# Patient Record
Sex: Female | Born: 1950 | Race: White | Hispanic: No | Marital: Married | Smoking: Never smoker
Health system: Southern US, Community
[De-identification: ages and names within clinical notes are randomized; demographics above are authoritative.]

## PROBLEM LIST (undated history)

## (undated) DIAGNOSIS — R112 Nausea with vomiting, unspecified: Secondary | ICD-10-CM

## (undated) DIAGNOSIS — K219 Gastro-esophageal reflux disease without esophagitis: Secondary | ICD-10-CM

## (undated) DIAGNOSIS — F329 Major depressive disorder, single episode, unspecified: Secondary | ICD-10-CM

## (undated) DIAGNOSIS — E039 Hypothyroidism, unspecified: Secondary | ICD-10-CM

## (undated) DIAGNOSIS — Z9889 Other specified postprocedural states: Secondary | ICD-10-CM

## (undated) DIAGNOSIS — Z9861 Coronary angioplasty status: Principal | ICD-10-CM

## (undated) DIAGNOSIS — I2510 Atherosclerotic heart disease of native coronary artery without angina pectoris: Principal | ICD-10-CM

## (undated) DIAGNOSIS — R238 Other skin changes: Secondary | ICD-10-CM

## (undated) DIAGNOSIS — F32A Depression, unspecified: Secondary | ICD-10-CM

## (undated) DIAGNOSIS — B029 Zoster without complications: Secondary | ICD-10-CM

## (undated) DIAGNOSIS — E785 Hyperlipidemia, unspecified: Secondary | ICD-10-CM

## (undated) DIAGNOSIS — I10 Essential (primary) hypertension: Secondary | ICD-10-CM

## (undated) DIAGNOSIS — F419 Anxiety disorder, unspecified: Secondary | ICD-10-CM

## (undated) DIAGNOSIS — R233 Spontaneous ecchymoses: Secondary | ICD-10-CM

## (undated) HISTORY — DX: Atherosclerotic heart disease of native coronary artery without angina pectoris: I25.10

## (undated) HISTORY — DX: Gastro-esophageal reflux disease without esophagitis: K21.9

## (undated) HISTORY — DX: Hypothyroidism, unspecified: E03.9

## (undated) HISTORY — DX: Hyperlipidemia, unspecified: E78.5

## (undated) HISTORY — DX: Coronary angioplasty status: Z98.61

## (undated) HISTORY — PX: CARDIAC CATHETERIZATION: SHX172

## (undated) HISTORY — DX: Major depressive disorder, single episode, unspecified: F32.9

## (undated) HISTORY — DX: Spontaneous ecchymoses: R23.3

## (undated) HISTORY — PX: TRANSTHORACIC ECHOCARDIOGRAM: SHX275

## (undated) HISTORY — DX: Zoster without complications: B02.9

## (undated) HISTORY — DX: Hypercalcemia: E83.52

## (undated) HISTORY — DX: Other skin changes: R23.8

## (undated) HISTORY — DX: Anxiety disorder, unspecified: F41.9

## (undated) HISTORY — DX: Depression, unspecified: F32.A

---

## 1990-03-31 HISTORY — PX: TUBAL LIGATION: SHX77

## 2003-07-14 ENCOUNTER — Ambulatory Visit (HOSPITAL_COMMUNITY): Admission: RE | Admit: 2003-07-14 | Discharge: 2003-07-14 | Payer: Self-pay | Admitting: Family Medicine

## 2009-05-29 DIAGNOSIS — B029 Zoster without complications: Secondary | ICD-10-CM

## 2009-05-29 HISTORY — DX: Zoster without complications: B02.9

## 2010-02-28 DIAGNOSIS — I2510 Atherosclerotic heart disease of native coronary artery without angina pectoris: Secondary | ICD-10-CM

## 2010-02-28 HISTORY — DX: Atherosclerotic heart disease of native coronary artery without angina pectoris: I25.10

## 2010-02-28 HISTORY — PX: OTHER SURGICAL HISTORY: SHX169

## 2010-03-05 HISTORY — PX: CORONARY ANGIOPLASTY: SHX604

## 2011-04-14 ENCOUNTER — Encounter (INDEPENDENT_AMBULATORY_CARE_PROVIDER_SITE_OTHER): Payer: Self-pay | Admitting: Surgery

## 2011-04-16 ENCOUNTER — Encounter (INDEPENDENT_AMBULATORY_CARE_PROVIDER_SITE_OTHER): Payer: Self-pay | Admitting: Surgery

## 2011-04-16 ENCOUNTER — Ambulatory Visit (INDEPENDENT_AMBULATORY_CARE_PROVIDER_SITE_OTHER): Payer: BC Managed Care – PPO | Admitting: Surgery

## 2011-04-16 ENCOUNTER — Encounter (INDEPENDENT_AMBULATORY_CARE_PROVIDER_SITE_OTHER): Payer: Self-pay

## 2011-04-16 ENCOUNTER — Ambulatory Visit (INDEPENDENT_AMBULATORY_CARE_PROVIDER_SITE_OTHER): Payer: Self-pay | Admitting: Surgery

## 2011-04-16 DIAGNOSIS — E210 Primary hyperparathyroidism: Secondary | ICD-10-CM

## 2011-04-16 NOTE — Progress Notes (Signed)
Chief Complaint  Patient presents with  . Parathyroid Adenoma    evaluate primary hyperparathyroidism - referral from Dr. Casimiro Needle Altheimer   HISTORY: Patient is a 61 year old white female referred by her endocrinologist for newly diagnosed primary hyperparathyroidism. Patient had noted elevated serum calcium levels for over one year. Recent workup included laboratory studies showing elevated serum calcium level of 11.9. Ionized calcium was elevated at 6.6. 24-hour urine collection for calcium was markedly elevated at 656 mg/dL. Intact PTH level was inappropriately normal in the upper range at 44.  The patient has undergone bone density scanning showing evidence of osteoporosis in the lumbar spine. She denies any history of nephrolithiasis. She does note fatigue. She notes some bone and joint discomfort. Patient has had no other diagnostic studies. She is referred to surgery at this time for evaluation.  Patient has had no prior head or neck surgery. There is no other family history of endocrinopathy. Patient does have hypothyroidism.  Past Medical History  Diagnosis Date  . Hypercalcemia   . Hyperparathyroidism   . Vitamin D deficiency   . Osteoporosis     DEXA 12/2010  . Hypothyroidism   . Dyslipidemia     s/p RCA stents 12/11, with another artery with 30 and 40% blockage  . Atherosclerotic heart disease   . Gastroesophageal reflux   . Shingles 3/11  . Depression   . Anxiety   . Hyperlipidemia   . Bruises easily      Current Outpatient Prescriptions  Medication Sig Dispense Refill  . Coenzyme Q10 (CO Q-10) 100 MG CAPS Take by mouth daily.      . fish oil-omega-3 fatty acids 1000 MG capsule Take 1,200 mg by mouth.      . Magnesium 400 MG CAPS Take by mouth daily.      . rosuvastatin (CRESTOR) 40 MG tablet Take 40 mg by mouth daily.      Marland Kitchen acyclovir (ZOVIRAX) 400 MG tablet Take 400 mg by mouth as needed.       . Cholecalciferol (VITAMIN D3) 2000 UNITS TABS Take by mouth.      .  escitalopram (LEXAPRO) 20 MG tablet Take 20 mg by mouth daily.      Marland Kitchen levothyroxine (SYNTHROID, LEVOTHROID) 75 MCG tablet Take 75 mcg by mouth daily.      . metoprolol succinate (TOPROL-XL) 25 MG 24 hr tablet Take 25 mg by mouth daily. Patient takes 1/2 dosage. Total of 12.5 mg.      . nitroGLYCERIN (NITROSTAT) 0.4 MG SL tablet Place 0.4 mg under the tongue every 5 (five) minutes as needed.      Marland Kitchen omeprazole (PRILOSEC) 40 MG capsule Take 40 mg by mouth daily.      . prasugrel (EFFIENT) 10 MG TABS Take by mouth.         Allergies  Allergen Reactions  . Codeine Nausea Only  . Sulfa Antibiotics Itching    Hands only, per patient. Happened in childhood.     Family History  Problem Relation Age of Onset  . Hypertension Father   . Aneurysm Father     abdominal  . Hypertension Mother   . Dementia Mother   . Cancer Mother     bladder  . Diabetes type II Mother   . Hyperlipidemia Brother   . Hyperparathyroidism Brother   . Depression Sister      History   Social History  . Marital Status: Married    Spouse Name: [name]    Number of  Children: [name]  . Years of Education: [name]   Social History Main Topics  . Smoking status: Never Smoker   . Smokeless tobacco: Never Used  . Alcohol Use: No     4 glasses per week  . Drug Use: No  . Sexually Active: None   Other Topics Concern  . None   Social History Narrative  . None     REVIEW OF SYSTEMS - PERTINENT POSITIVES ONLY: Patient notes fatigue. Patient notes bone and joint pain. Patient denies nephrolithiasis. She denies fractures.  EXAM: Filed Vitals:   04/16/11 0956  BP: 118/72  Pulse: 80  Temp: 97 F (36.1 C)  Resp: 16    HEENT: normocephalic; pupils equal and reactive; sclerae clear; dentition good; mucous membranes moist NECK:  No nodules; symmetric on extension; no palpable anterior or posterior cervical lymphadenopathy; no supraclavicular masses; no tenderness CHEST: clear to auscultation bilaterally without  rales, rhonchi, or wheezes CARDIAC: regular rate and rhythm without significant murmur; peripheral pulses are full ABDOMEN: soft without distension; bowel sounds present; no mass; no hepatosplenomegaly; no hernia EXT:  non-tender without edema; no deformity NEURO: no gross focal deficits; no sign of tremor   LABORATORY RESULTS: See [facility] (CHL-Epic) for most recent results   RADIOLOGY RESULTS: See [facility] (CHL-Epic) for most recent results   IMPRESSION: #1 primary hyperparathyroidism #2 hypothyroidism #3 coronary artery disease  PLAN: The patient and I discussed all the above findings. We reviewed her studies. I have recommended sestamibi scan and we will arrange for that to be performed in the near future. I will contact her with the results. If the sestamibi scan localizes a parathyroid adenoma, I believe she would be an excellent candidate for minimally invasive surgery. If the sestamibi scan is nonrevealing, then we will perform an MRI scan in hopes of localizing the parathyroid adenoma. Patient will require cardiac clearance given her history of coronary artery disease and stent placement. She is on anticoagulation.  Sestamibi scan has been scheduled in the near future. We will await those results. We will ask for cardiac clearance in the interim.  Velora Heckler, MD, FACS General & Endocrine Surgery [facility]   Visit Diagnoses: 1. Hypercalcemia     Primary Care Physician: Eartha Inch, MD, MD  Endocrine:  Dr. Casimiro Needle Altheimer Cardiology:  Dr. Caprice Kluver

## 2011-04-16 NOTE — Patient Instructions (Signed)
Will call with results of sestamibi scan.  tmg

## 2011-04-22 ENCOUNTER — Encounter (HOSPITAL_COMMUNITY)
Admission: RE | Admit: 2011-04-22 | Discharge: 2011-04-22 | Disposition: A | Discharge status: Home / Self Care | Payer: BC Managed Care – PPO | Source: Ambulatory Visit | Attending: Surgery | Admitting: Surgery

## 2011-04-22 ENCOUNTER — Ambulatory Visit (HOSPITAL_COMMUNITY)
Admission: RE | Admit: 2011-04-22 | Discharge: 2011-04-22 | Disposition: A | Discharge status: Home / Self Care | Payer: BC Managed Care – PPO | Source: Ambulatory Visit | Attending: Surgery | Admitting: Surgery

## 2011-04-22 DIAGNOSIS — E213 Hyperparathyroidism, unspecified: Secondary | ICD-10-CM | POA: Insufficient documentation

## 2011-04-22 MED ORDER — TECHNETIUM TC 99M SESTAMIBI - CARDIOLITE
25.0000 | Freq: Once | INTRAVENOUS | Status: AC | PRN
  Administered 2011-04-22: 25 via INTRAVENOUS

## 2011-04-23 ENCOUNTER — Other Ambulatory Visit (INDEPENDENT_AMBULATORY_CARE_PROVIDER_SITE_OTHER): Payer: Self-pay | Admitting: Surgery

## 2011-04-23 NOTE — Progress Notes (Signed)
MRI ordered to Kim Herman to schedule.

## 2011-04-24 ENCOUNTER — Telehealth (INDEPENDENT_AMBULATORY_CARE_PROVIDER_SITE_OTHER): Payer: Self-pay | Admitting: Surgery

## 2011-04-24 NOTE — Progress Notes (Signed)
Addended by: Charise Carwin on: 04/24/2011 10:55 AM   Modules accepted: Orders

## 2011-04-29 ENCOUNTER — Ambulatory Visit (HOSPITAL_COMMUNITY)
Admission: RE | Admit: 2011-04-29 | Discharge: 2011-04-29 | Disposition: A | Discharge status: Home / Self Care | Payer: BC Managed Care – PPO | Source: Ambulatory Visit | Attending: Surgery | Admitting: Surgery

## 2011-04-29 DIAGNOSIS — E049 Nontoxic goiter, unspecified: Secondary | ICD-10-CM | POA: Insufficient documentation

## 2011-04-29 DIAGNOSIS — E213 Hyperparathyroidism, unspecified: Secondary | ICD-10-CM | POA: Insufficient documentation

## 2011-04-29 DIAGNOSIS — R9389 Abnormal findings on diagnostic imaging of other specified body structures: Secondary | ICD-10-CM | POA: Insufficient documentation

## 2011-04-29 LAB — BUN: BUN: 13 mg/dL (ref 6–23)

## 2011-04-29 LAB — CREATININE, SERUM
Creatinine, Ser: 0.7 mg/dL (ref 0.50–1.10)
GFR calc non Af Amer: >90 mL/min (ref >90)

## 2011-04-29 MED ORDER — GADOBENATE DIMEGLUMINE 529 MG/ML IV SOLN
15.0000 mL | Freq: Once | INTRAVENOUS | Status: AC
  Administered 2011-04-29: 15 mL via INTRAVENOUS

## 2011-05-01 ENCOUNTER — Telehealth (INDEPENDENT_AMBULATORY_CARE_PROVIDER_SITE_OTHER): Payer: Self-pay

## 2011-05-01 NOTE — Telephone Encounter (Signed)
error 

## 2011-05-02 ENCOUNTER — Other Ambulatory Visit (INDEPENDENT_AMBULATORY_CARE_PROVIDER_SITE_OTHER): Payer: Self-pay | Admitting: Surgery

## 2011-05-02 DIAGNOSIS — E210 Primary hyperparathyroidism: Secondary | ICD-10-CM

## 2011-05-02 NOTE — Progress Notes (Signed)
Called results of MRI and sestamibi scan to patient.  Discussed indications for surgery.  Discussed minimally invasive procedure with target of right inferior gland.  Discussed possibility of four gland exploration with overnight stay post op.  Patient understands and wishes to proceed.  Will need to hold anticoagulation for 5-7 days pre op.  The risks and benefits of the procedure have been discussed at length with the patient.  The patient understands the proposed procedure, potential alternative treatments, and the course of recovery to be expected.  All of the patient's questions have been answered at this time.  The patient wishes to proceed with surgery and will schedule a date for their procedure through our office staff.  Velora Heckler, MD, FACS General & Endocrine Surgery [facility]

## 2011-05-14 ENCOUNTER — Encounter (HOSPITAL_COMMUNITY): Payer: Self-pay | Admitting: Pharmacy Technician

## 2011-05-19 ENCOUNTER — Ambulatory Visit (HOSPITAL_COMMUNITY)
Admission: RE | Admit: 2011-05-19 | Discharge: 2011-05-19 | Disposition: A | Discharge status: Home / Self Care | Payer: BC Managed Care – PPO | Source: Ambulatory Visit | Attending: Surgery | Admitting: Surgery

## 2011-05-19 ENCOUNTER — Encounter (HOSPITAL_COMMUNITY): Payer: Self-pay

## 2011-05-19 ENCOUNTER — Encounter (HOSPITAL_COMMUNITY)
Admission: RE | Admit: 2011-05-19 | Discharge: 2011-05-19 | Disposition: A | Discharge status: Home / Self Care | Payer: BC Managed Care – PPO | Source: Ambulatory Visit | Attending: Surgery | Admitting: Surgery

## 2011-05-19 DIAGNOSIS — K219 Gastro-esophageal reflux disease without esophagitis: Secondary | ICD-10-CM | POA: Insufficient documentation

## 2011-05-19 DIAGNOSIS — Z01812 Encounter for preprocedural laboratory examination: Secondary | ICD-10-CM | POA: Insufficient documentation

## 2011-05-19 DIAGNOSIS — I10 Essential (primary) hypertension: Secondary | ICD-10-CM | POA: Insufficient documentation

## 2011-05-19 DIAGNOSIS — I2510 Atherosclerotic heart disease of native coronary artery without angina pectoris: Secondary | ICD-10-CM | POA: Insufficient documentation

## 2011-05-19 DIAGNOSIS — E210 Primary hyperparathyroidism: Secondary | ICD-10-CM | POA: Insufficient documentation

## 2011-05-19 DIAGNOSIS — M4120 Other idiopathic scoliosis, site unspecified: Secondary | ICD-10-CM | POA: Insufficient documentation

## 2011-05-19 DIAGNOSIS — Z01818 Encounter for other preprocedural examination: Secondary | ICD-10-CM | POA: Insufficient documentation

## 2011-05-19 HISTORY — DX: Nausea with vomiting, unspecified: R11.2

## 2011-05-19 HISTORY — DX: Nausea with vomiting, unspecified: Z98.890

## 2011-05-19 HISTORY — DX: Essential (primary) hypertension: I10

## 2011-05-19 LAB — CBC
MCH: 30.1 pg (ref 26.0–34.0)
MCV: 90.3 fL (ref 78.0–100.0)
Platelets: 240 10*3/uL (ref 150–400)
RDW: 12.3 % (ref 11.5–15.5)
WBC: 4.3 10*3/uL (ref 4.0–10.5)

## 2011-05-19 LAB — BASIC METABOLIC PANEL
Calcium: 11.9 mg/dL — ABNORMAL HIGH (ref 8.4–10.5)
Creatinine, Ser: 0.67 mg/dL (ref 0.50–1.10)
GFR calc Af Amer: >90 mL/min (ref >90)
Sodium: 138 mEq/L (ref 135–145)

## 2011-05-19 LAB — SURGICAL PCR SCREEN: MRSA, PCR: NEGATIVE

## 2011-05-19 NOTE — Patient Instructions (Signed)
20 Kim Herman  05/19/2011   Your procedure is scheduled on:  05/22/11 0940am-1110am  Report to [facility] at 0740 AM.  Call this number if you have problems the morning of surgery: [phone]   Remember:   Do not eat food:After Midnight.  May have clear liquids:until Midnight .  Marland Kitchen  Take these medicines the morning of surgery with A SIP OF WATER:   Do not wear jewelry, make-up or nail polish.  Do not wear lotions, powders, or perfumes. .  Do not shave 48 hours prior to surgery.  Do not bring valuables to the hospital.  Contacts, dentures or bridgework may not be worn into surgery.  Leave suitcase in the car. After surgery it may be brought to your room.  For patients admitted to the hospital, checkout time is 11:00 AM the day of discharge.      Special Instructions: CHG Shower Use Special Wash: 1/2 bottle night before surgery and 1/2 bottle morning of surgery. Shower chin to toes with CHG.  Wash face and private parts with regular soap.     Please read over the following fact sheets that you were given: MRSA Information, coughing and deep breathing exercises, leg exercises

## 2011-05-19 NOTE — Pre-Procedure Instructions (Signed)
EKG 08/21/10 EKG on chart  LOV note Dr Clarene Duke ( cardiology) 11/13/10 on chart  Echo 05/21/10 on chart  Cath note 03/05/10 on chart

## 2011-05-20 NOTE — Progress Notes (Signed)
Quick Note:  These results are acceptable for scheduled surgery. TMG ______ 

## 2011-05-20 NOTE — Progress Notes (Signed)
Faxed to [employer] on 05/20/2011.

## 2011-05-22 ENCOUNTER — Ambulatory Visit (HOSPITAL_COMMUNITY)
Admission: RE | Admit: 2011-05-22 | Discharge: 2011-05-22 | Disposition: A | Discharge status: Home / Self Care | Payer: BC Managed Care – PPO | Source: Ambulatory Visit | Attending: Surgery | Admitting: Surgery

## 2011-05-22 ENCOUNTER — Encounter (HOSPITAL_COMMUNITY): Payer: Self-pay | Admitting: Anesthesiology

## 2011-05-22 ENCOUNTER — Other Ambulatory Visit (INDEPENDENT_AMBULATORY_CARE_PROVIDER_SITE_OTHER): Payer: Self-pay | Admitting: Surgery

## 2011-05-22 ENCOUNTER — Encounter (HOSPITAL_COMMUNITY)
Admission: RE | Disposition: A | Discharge status: Home / Self Care | Payer: Self-pay | Source: Ambulatory Visit | Attending: Surgery

## 2011-05-22 ENCOUNTER — Encounter (HOSPITAL_COMMUNITY): Payer: Self-pay | Admitting: *Deleted

## 2011-05-22 ENCOUNTER — Ambulatory Visit (HOSPITAL_COMMUNITY): Payer: BC Managed Care – PPO | Admitting: Anesthesiology

## 2011-05-22 DIAGNOSIS — E210 Primary hyperparathyroidism: Secondary | ICD-10-CM

## 2011-05-22 DIAGNOSIS — K219 Gastro-esophageal reflux disease without esophagitis: Secondary | ICD-10-CM | POA: Insufficient documentation

## 2011-05-22 DIAGNOSIS — E039 Hypothyroidism, unspecified: Secondary | ICD-10-CM | POA: Insufficient documentation

## 2011-05-22 DIAGNOSIS — E785 Hyperlipidemia, unspecified: Secondary | ICD-10-CM | POA: Insufficient documentation

## 2011-05-22 DIAGNOSIS — I10 Essential (primary) hypertension: Secondary | ICD-10-CM | POA: Insufficient documentation

## 2011-05-22 DIAGNOSIS — Z79899 Other long term (current) drug therapy: Secondary | ICD-10-CM | POA: Insufficient documentation

## 2011-05-22 DIAGNOSIS — M810 Age-related osteoporosis without current pathological fracture: Secondary | ICD-10-CM | POA: Insufficient documentation

## 2011-05-22 DIAGNOSIS — D351 Benign neoplasm of parathyroid gland: Secondary | ICD-10-CM

## 2011-05-22 HISTORY — PX: PARATHYROIDECTOMY: SHX19

## 2011-05-22 SURGERY — PARATHYROIDECTOMY
Anesthesia: General | Site: Neck | Wound class: Clean

## 2011-05-22 MED ORDER — 0.9 % SODIUM CHLORIDE (POUR BTL) OPTIME
TOPICAL | Status: DC | PRN
  Administered 2011-05-22: 1000 mL

## 2011-05-22 MED ORDER — MIDAZOLAM HCL 5 MG/5ML IJ SOLN
INTRAMUSCULAR | Status: DC | PRN
  Administered 2011-05-22: 2 mg via INTRAVENOUS

## 2011-05-22 MED ORDER — HYDROCODONE-ACETAMINOPHEN 5-325 MG PO TABS
1.0000 | ORAL_TABLET | ORAL | Status: DC | PRN
Start: 2011-05-22 — End: 2011-05-22
  Administered 2011-05-22: 1 via ORAL

## 2011-05-22 MED ORDER — NEOSTIGMINE METHYLSULFATE 1 MG/ML IJ SOLN
INTRAMUSCULAR | Status: DC | PRN
  Administered 2011-05-22: 4 mg via INTRAVENOUS

## 2011-05-22 MED ORDER — LACTATED RINGERS IV SOLN
INTRAVENOUS | Status: DC
  Administered 2011-05-22: 11:00:00 via INTRAVENOUS
  Administered 2011-05-22: 1000 mL via INTRAVENOUS

## 2011-05-22 MED ORDER — EPHEDRINE SULFATE 50 MG/ML IJ SOLN
INTRAMUSCULAR | Status: DC | PRN
  Administered 2011-05-22: 10 mg via INTRAVENOUS

## 2011-05-22 MED ORDER — LACTATED RINGERS IV SOLN: INTRAVENOUS | Status: DC

## 2011-05-22 MED ORDER — PROPOFOL 10 MG/ML IV BOLUS
INTRAVENOUS | Status: DC | PRN
  Administered 2011-05-22: 150 mg via INTRAVENOUS

## 2011-05-22 MED ORDER — ACETAMINOPHEN 10 MG/ML IV SOLN
INTRAVENOUS | Status: DC | PRN
  Administered 2011-05-22: 1000 mg via INTRAVENOUS

## 2011-05-22 MED ORDER — FENTANYL CITRATE 0.05 MG/ML IJ SOLN
INTRAMUSCULAR | Status: DC | PRN
  Administered 2011-05-22: 100 ug via INTRAVENOUS

## 2011-05-22 MED ORDER — FENTANYL CITRATE 0.05 MG/ML IJ SOLN: 25.0000 ug | INTRAMUSCULAR | Status: DC | PRN

## 2011-05-22 MED ORDER — HYDROCODONE-ACETAMINOPHEN 10-325 MG PO TABS: 1.0000 | ORAL_TABLET | ORAL | Status: AC | PRN

## 2011-05-22 MED ORDER — LIDOCAINE HCL (CARDIAC) 20 MG/ML IV SOLN
INTRAVENOUS | Status: DC | PRN
  Administered 2011-05-22: 5 mg via INTRAVENOUS

## 2011-05-22 MED ORDER — GLYCOPYRROLATE 0.2 MG/ML IJ SOLN
INTRAMUSCULAR | Status: DC | PRN
  Administered 2011-05-22: .6 mg via INTRAVENOUS

## 2011-05-22 MED ORDER — CEFAZOLIN SODIUM 1-5 GM-% IV SOLN
1.0000 g | INTRAVENOUS | Status: AC
  Administered 2011-05-22: 1 g via INTRAVENOUS

## 2011-05-22 MED ORDER — ONDANSETRON HCL 4 MG/2ML IJ SOLN
INTRAMUSCULAR | Status: DC | PRN
  Administered 2011-05-22: 4 mg via INTRAVENOUS

## 2011-05-22 MED ORDER — ROCURONIUM BROMIDE 100 MG/10ML IV SOLN
INTRAVENOUS | Status: DC | PRN
  Administered 2011-05-22: 40 mg via INTRAVENOUS

## 2011-05-22 MED ORDER — BUPIVACAINE HCL 0.25 % IJ SOLN
INTRAMUSCULAR | Status: DC | PRN
  Administered 2011-05-22: 10 mL

## 2011-05-22 MED ORDER — PROMETHAZINE HCL 25 MG/ML IJ SOLN: 6.2500 mg | INTRAMUSCULAR | Status: DC | PRN

## 2011-05-22 SURGICAL SUPPLY — 41 items
ATTRACTOMAT 16X20 MAGNETIC DRP (DRAPES) ×2 IMPLANT
BENZOIN TINCTURE PRP APPL 2/3 (GAUZE/BANDAGES/DRESSINGS) ×2 IMPLANT
BLADE HEX COATED 2.75 (ELECTRODE) ×2 IMPLANT
BLADE SURG 15 STRL LF DISP TIS (BLADE) ×1 IMPLANT
BLADE SURG 15 STRL SS (BLADE) ×1
CANISTER SUCTION 2500CC (MISCELLANEOUS) ×2 IMPLANT
CHLORAPREP W/TINT 10.5 ML (MISCELLANEOUS) ×2 IMPLANT
CLIP TI MEDIUM 6 (CLIP) ×2 IMPLANT
CLIP TI WIDE RED SMALL 6 (CLIP) ×2 IMPLANT
CLOSURE STERI STRIP 1/2 X4 (GAUZE/BANDAGES/DRESSINGS) ×2 IMPLANT
CLOTH BEACON ORANGE TIMEOUT ST (SAFETY) ×2 IMPLANT
DISSECTOR ROUND CHERRY 3/8 STR (MISCELLANEOUS) IMPLANT
DRAPE PED LAPAROTOMY (DRAPES) ×2 IMPLANT
DRESSING SURGICEL FIBRLLR 1X2 (HEMOSTASIS) ×1 IMPLANT
DRSG SURGICEL FIBRILLAR 1X2 (HEMOSTASIS) ×2
ELECT REM PT RETURN 9FT ADLT (ELECTROSURGICAL) ×2
ELECTRODE REM PT RTRN 9FT ADLT (ELECTROSURGICAL) ×1 IMPLANT
GAUZE SPONGE 4X4 16PLY XRAY LF (GAUZE/BANDAGES/DRESSINGS) ×2 IMPLANT
GLOVE SURG ORTHO 8.0 STRL STRW (GLOVE) ×2 IMPLANT
GOWN STRL NON-REIN LRG LVL3 (GOWN DISPOSABLE) ×2 IMPLANT
GOWN STRL REIN XL XLG (GOWN DISPOSABLE) ×4 IMPLANT
KIT BASIN OR (CUSTOM PROCEDURE TRAY) ×2 IMPLANT
NEEDLE HYPO 25X1 1.5 SAFETY (NEEDLE) ×2 IMPLANT
NS IRRIG 1000ML POUR BTL (IV SOLUTION) ×2 IMPLANT
PACK BASIC VI WITH GOWN DISP (CUSTOM PROCEDURE TRAY) ×2 IMPLANT
PENCIL BUTTON HOLSTER BLD 10FT (ELECTRODE) ×2 IMPLANT
SPONGE GAUZE 4X4 12PLY (GAUZE/BANDAGES/DRESSINGS) IMPLANT
SPONGE GAUZE 4X4 FOR O.R. (GAUZE/BANDAGES/DRESSINGS) ×2 IMPLANT
STAPLER VISISTAT 35W (STAPLE) ×2 IMPLANT
STRIP CLOSURE SKIN 1/2X4 (GAUZE/BANDAGES/DRESSINGS) ×2 IMPLANT
SUT MNCRL AB 4-0 PS2 18 (SUTURE) ×2 IMPLANT
SUT SILK 2 0 (SUTURE) ×1
SUT SILK 2-0 18XBRD TIE 12 (SUTURE) ×1 IMPLANT
SUT SILK 3 0 (SUTURE)
SUT SILK 3-0 18XBRD TIE 12 (SUTURE) IMPLANT
SUT VIC AB 3-0 SH 18 (SUTURE) ×2 IMPLANT
SYR BULB IRRIGATION 50ML (SYRINGE) ×2 IMPLANT
SYR CONTROL 10ML LL (SYRINGE) ×2 IMPLANT
TAPE CLOTH SURG 4X10 WHT LF (GAUZE/BANDAGES/DRESSINGS) ×2 IMPLANT
TOWEL OR 17X26 10 PK STRL BLUE (TOWEL DISPOSABLE) ×2 IMPLANT
YANKAUER SUCT BULB TIP 10FT TU (MISCELLANEOUS) ×2 IMPLANT

## 2011-05-22 NOTE — Anesthesia Postprocedure Evaluation (Signed)
  Anesthesia Post-op Note  Patient: Kim Herman  Procedure(s) Performed: Procedure(s) (LRB): PARATHYROIDECTOMY ([name])  Patient Location: PACU  Anesthesia Type: General  Level of Consciousness: awake and alert   Airway and Oxygen Therapy: Patient Spontanous Breathing  Post-op Pain: mild  Post-op Assessment: Post-op Vital signs reviewed, Patient's Cardiovascular Status Stable, Respiratory Function Stable, Patent Airway and No signs of Nausea or vomiting  Post-op Vital Signs: stable  Complications: No apparent anesthesia complications

## 2011-05-22 NOTE — Brief Op Note (Signed)
05/22/2011  10:56 AM  PATIENT:  Tracey Harries Matos  61 y.o. female  PRE-OPERATIVE DIAGNOSIS:  primary hyperparathyroidism  POST-OPERATIVE DIAGNOSIS:  primary hyperparathyroidism  PROCEDURE:  Procedure(s) (LRB): PARATHYROIDECTOMY ([name])  SURGEON:  Surgeon(s) and Role:    * Velora Heckler, MD - Primary  ASSISTANTS: none   ANESTHESIA:   general  EBL:  Total I/O In: 1000 [I.V.:1000] Out: -   BLOOD ADMINISTERED:none  DRAINS: none   LOCAL MEDICATIONS USED:  MARCAINE  10cc  SPECIMEN:  Excision  DISPOSITION OF SPECIMEN:  PATHOLOGY  COUNTS:  YES  DICTATION: .Other Dictation: Dictation Number [id number]  PLAN OF CARE: Discharge to home after PACU  PATIENT DISPOSITION:  PACU - hemodynamically stable.   Delay start of Pharmacological VTE agent (>24hrs) due to surgical blood loss or risk of bleeding: yes  Velora Heckler, MD, FACS General & Endocrine Surgery [facility]

## 2011-05-22 NOTE — Preoperative (Signed)
Beta Blockers   Reason not to administer Beta Blockers:Took Metoprolol 25mg  this am

## 2011-05-22 NOTE — Anesthesia Preprocedure Evaluation (Addendum)
Anesthesia Evaluation  Patient identified by MRN, date of birth, ID band Patient awake    Reviewed: Allergy & Precautions, H&P , NPO status , Patient's Chart, lab work & pertinent test results, reviewed documented beta blocker date and time   History of Anesthesia Complications (+) PONV  Airway Mallampati: II TM Distance: >3 FB Neck ROM: full    Dental No notable dental hx. (+) Teeth Intact and Dental Advisory Given   Pulmonary neg pulmonary ROS,  clear to auscultation  Pulmonary exam normal       Cardiovascular Exercise Tolerance: Good hypertension, Pt. on home beta blockers + CAD, + Cardiac Stents and neg cardio ROS regular Normal Stent 12/11   Neuro/Psych Anxiety Depression Negative Neurological ROS  Negative Psych ROS   GI/Hepatic negative GI ROS, Neg liver ROS, GERD-  ,  Endo/Other  Negative Endocrine ROSHypothyroidism   Renal/GU negative Renal ROS  Genitourinary negative   Musculoskeletal   Abdominal   Peds  Hematology negative hematology ROS (+)   Anesthesia Other Findings   Reproductive/Obstetrics negative OB ROS                          Anesthesia Physical Anesthesia Plan  ASA: III  Anesthesia Plan: General   Post-op Pain Management:    Induction: Intravenous  Airway Management Planned: Oral ETT  Additional Equipment:   Intra-op Plan:   Post-operative Plan: Extubation in OR  Informed Consent: I have reviewed the patients History and Physical, chart, labs and discussed the procedure including the risks, benefits and alternatives for the proposed anesthesia with the patient or authorized representative who has indicated his/her understanding and acceptance.   Dental Advisory Given  Plan Discussed with: CRNA and Surgeon  Anesthesia Plan Comments:         Anesthesia Quick Evaluation

## 2011-05-22 NOTE — H&P (Addendum)
Kim Herman is an 61 y.o. female.   Chief Complaint: Primary hyperparathyroidism HPI: Kim Herman is a 61 year old white female referred by her endocrinologist for newly diagnosed primary hyperparathyroidism. Kim Herman had noted elevated serum calcium levels for over one year. Recent workup included laboratory studies showing elevated serum calcium level of 11.9. Ionized calcium was elevated at 6.6. 24-hour urine collection for calcium was markedly elevated at 656 mg/dL. Intact PTH level was inappropriately normal in the upper range at 44. The Kim Herman has undergone bone density scanning showing evidence of osteoporosis in the lumbar spine. She denies any history of nephrolithiasis. She does note fatigue. She notes some bone and joint discomfort. Kim Herman has had no other diagnostic studies. She is referred to surgery at this time for evaluation.  Kim Herman has had no prior head or neck surgery. There is no other family history of endocrinopathy. Kim Herman does have hypothyroidism.   Past Medical History  Diagnosis Date  . Hypercalcemia   . Hyperparathyroidism   . Vitamin d deficiency   . Osteoporosis     DEXA 12/2010  . Hypothyroidism   . Dyslipidemia     s/p RCA stents 12/11, with another artery with 30 and 40% blockage  . Atherosclerotic heart disease   . Gastroesophageal reflux   . Shingles 3/11  . Depression   . Anxiety   . Hyperlipidemia   . Bruises easily   . Hypertension   . PONV (postoperative nausea and vomiting)     Past Surgical History  Procedure Date  . Cardiac stents 03/2011    RCA  . Tubal ligation 1992  . Cardiac catheterization     Family History  Problem Relation Age of Onset  . Hypertension Father   . Aneurysm Father     abdominal  . Hypertension Mother   . Dementia Mother   . Cancer Mother     bladder  . Diabetes type II Mother   . Hyperlipidemia Brother   . Hyperparathyroidism Brother   . Depression Sister    Social History:  reports that she has never  smoked. She has never used smokeless tobacco. She reports that she does not drink alcohol or use illicit drugs.  Allergies:  Allergies  Allergen Reactions  . Codeine Nausea Only  . Sulfa Antibiotics Itching    Hands only, per Kim Herman. Happened in childhood.    Medications Prior to Admission  Medication Dose Route Frequency Provider Last Rate Last Dose  . ceFAZolin (ANCEF) IVPB 1 g/50 mL premix  1 g Intravenous 60 min Pre-Op Velora Heckler, MD      . lactated ringers infusion   Intravenous Continuous Gaetano Hawthorne, MD 100 mL/hr at 05/22/11 0853 1,000 mL at 05/22/11 0853   Medications Prior to Admission  Medication Sig Dispense Refill  . acyclovir (ZOVIRAX) 400 MG tablet Take 400 mg by mouth 2 (two) times daily as needed. Outbreak       . Cholecalciferol (VITAMIN D3) 2000 UNITS TABS Take 2,000 Units by mouth daily.       . Coenzyme Q10 (CO Q-10) 100 MG CAPS Take 100 mg by mouth daily.       Marland Kitchen escitalopram (LEXAPRO) 20 MG tablet Take 20 mg by mouth daily after breakfast.       . fish oil-omega-3 fatty acids 1000 MG capsule Take 1,200 mg by mouth daily.       Marland Kitchen levothyroxine (SYNTHROID, LEVOTHROID) 75 MCG tablet Take 75 mcg by mouth at bedtime.       Marland Kitchen  Magnesium 400 MG CAPS Take 400 mg by mouth daily.       . metoprolol succinate (TOPROL-XL) 25 MG 24 hr tablet Take 12.5 mg by mouth daily after breakfast.       . omeprazole (PRILOSEC) 40 MG capsule Take 40 mg by mouth daily.      . prasugrel (EFFIENT) 10 MG TABS Take 10 mg by mouth daily.       . nitroGLYCERIN (NITROSTAT) 0.4 MG SL tablet Place 0.4 mg under the tongue every 5 (five) minutes as needed. Chest pain         No results found for this or any previous visit (from the past 48 hour(s)). No results found.  Review of Systems  Constitutional: Negative.   HENT: Negative.   Eyes: Negative.   Respiratory: Negative.   Cardiovascular: Positive for palpitations.       Arrhythmia  Gastrointestinal: Negative.   Genitourinary:  Negative.   Musculoskeletal: Negative.   Skin: Negative.   Neurological: Negative.   Endo/Heme/Allergies: Negative.   Psychiatric/Behavioral: Negative.     Blood pressure 117/66, pulse 65, temperature 98.4 F (36.9 C), resp. rate 20, SpO2 96.00%. Physical Exam  Constitutional: She is oriented to person, place, and time. She appears well-developed and well-nourished.  HENT:  Head: Normocephalic and atraumatic.  Right Ear: External ear normal.  Left Ear: External ear normal.  Nose: Nose normal.  Mouth/Throat: Oropharynx is clear and moist.  Eyes: EOM are normal. Pupils are equal, round, and reactive to light.  Neck: Normal range of motion. Neck supple.  Cardiovascular: Normal rate, normal heart sounds and intact distal pulses.        irreg rhythm  Respiratory: Effort normal and breath sounds normal.  GI: Soft. Bowel sounds are normal.  Musculoskeletal: Normal range of motion.  Neurological: She is alert and oriented to person, place, and time. She has normal reflexes.  Skin: Skin is warm and dry.  Psychiatric: She has a normal mood and affect. Her behavior is normal. Judgment and thought content normal.     Assessment/Plan Primary hyperparathyroidism  Suspect right inferior parathyroid adenoma  Plan neck exploration and parathyroidectomy  The risks and benefits of the procedure have been discussed at length with the Kim Herman.  The Kim Herman understands the proposed procedure, potential alternative treatments, and the course of recovery to be expected.  All of the Kim Herman's questions have been answered at this time.  The Kim Herman wishes to proceed with surgery.  Velora Heckler, MD, FACS General & Endocrine Surgery [facility]  Error in ROS and Exam -- no cardiac arrhythmia, no irregular heartbeat; mild bradycardia @ 53 beats per minute. tmg   Neel Buffone M 05/22/2011, 9:58 AM

## 2011-05-22 NOTE — Transfer of Care (Signed)
Immediate Anesthesia Transfer of Care Note  Patient: Kim Herman  Procedure(s) Performed: Procedure(s) (LRB): PARATHYROIDECTOMY ([name])  Patient Location: PACU  Anesthesia Type: General  Level of Consciousness: awake, alert  and oriented  Airway & Oxygen Therapy: Patient Spontanous Breathing and Patient connected to face mask oxygen  Post-op Assessment: Report given to PACU RN and Post -op Vital signs reviewed and stable  Post vital signs: Reviewed and stable  Complications: No apparent anesthesia complications

## 2011-05-23 NOTE — Op Note (Signed)
Kim Herman, Kim Herman NO.:  [id number]  MEDICAL RECORD NO.:  [id number]  LOCATION:  [facility]                         FACILITY:  [facility]  PHYSICIAN:  Velora Heckler, MD      DATE OF BIRTH:  [birth year]  DATE OF PROCEDURE:  05/22/2011                               OPERATIVE REPORT   PREOPERATIVE DIAGNOSIS:  Primary hyperparathyroidism.  POSTOP DIAGNOSIS:  Primary hyperparathyroidism.  PROCEDURE:  Right inferior minimally invasive parathyroidectomy.  SURGEON:  Velora Heckler, MD, FACS  ANESTHESIA:  General.  ESTIMATED BLOOD LOSS:  Minimal.  PREPARATION:  ChloraPrep.  COMPLICATIONS:  None.  INDICATIONS:  The patient is a 61 year old, white female, referred by her endocrinologist with primary hyperparathyroidism.  Serum calcium levels were elevated at 11.9 with an elevated ionized calcium level of 6.6.  24-hour urine collection for calcium was elevated at 656 mg/dL. Intact PTH level was inappropriately normal in the upper range at 44. Bone density scanning showed osteoporosis in the lumbar spine.  The patient now comes to surgery for neck exploration and parathyroidectomy. MRI scan of the neck had localized nodular density consistent with parathyroid adenoma to the right inferior position.  BODY OF REPORT:  Procedure was done in OR #6 at the [facility].  The patient was brought to the operating room, placed in supine position on the operating room table.  Following administration of general anesthesia, the patient was positioned and then prepped and draped in the usual strict aseptic fashion.  After ascertaining that an adequate level of anesthesia had been achieved, a right inferior neck incision was made with #15 blade.  Dissection was carried through subcutaneous tissues and platysma.  Subplatysmal flaps were developed with the electrocautery and a Weitlaner retractors placed for exposure.  Strap muscles were incised in the midline and the  right thyroid lobe was exposed.  Strap muscles were reflected laterally and the right inferior pole was mobilized.  There was an obvious nodular mass measuring approximately 1 cm in size immediately inferior and posterior to the inferior pole of the right thyroid lobe.  This was gently dissected out using the electrocautery for hemostasis.  Vascular structures were divided between small and medium Ligaclips with the Metzenbaum scissors.  The lesion was excised.  It measured approximately 1 cm across and was the consistency of a parathyroid adenoma.  It was submitted to pathology for frozen section.  Dr. Jimmy Picket reviewed this and felt that this was hyperplastic parathyroid tissue consistent with adenoma.  Neck was irrigated.  Good hemostasis was noted.  Surgicel was placed in the operative field.  Strap muscles were reapproximated in the midline with interrupted 3-0 Vicryl sutures.  Platysma was closed with interrupted 3-0 Vicryl sutures.  Skin was anesthetized with local anesthetic.  Skin edges were reapproximated with a running 4-0 Monocryl subcuticular suture.  Wound was washed and dried and benzoin and Steri- Strips were applied.  Sterile dressings were applied.  The patient was awakened from anesthesia and brought to the recovery room.  The patient tolerated the procedure well.   Velora Heckler, MD, FACS   TMG/MEDQ  D:  05/22/2011  T:  05/23/2011  Job:  [id number]  cc:   Veverly Fells. Altheimer, M.D. Fax: [phone]  Jamey Reas, MD Fax: [phone]  Thereasa Solo. Little, M.D. Fax: [phone]

## 2011-05-27 ENCOUNTER — Encounter (INDEPENDENT_AMBULATORY_CARE_PROVIDER_SITE_OTHER): Payer: Self-pay

## 2011-06-02 ENCOUNTER — Encounter (HOSPITAL_COMMUNITY): Payer: Self-pay | Admitting: Surgery

## 2011-06-16 ENCOUNTER — Encounter (INDEPENDENT_AMBULATORY_CARE_PROVIDER_SITE_OTHER): Payer: Self-pay | Admitting: Surgery

## 2011-06-16 ENCOUNTER — Ambulatory Visit (INDEPENDENT_AMBULATORY_CARE_PROVIDER_SITE_OTHER): Payer: BC Managed Care – PPO | Admitting: Surgery

## 2011-06-16 VITALS — BP 108/76 | HR 80 | Temp 98.0°F | Resp 16 | Ht 67.0 in | Wt 154.4 lb

## 2011-06-16 DIAGNOSIS — E210 Primary hyperparathyroidism: Secondary | ICD-10-CM

## 2011-06-16 NOTE — Progress Notes (Signed)
Visit Diagnoses: 1. Hyperparathyroidism, primary     HISTORY: Patient returns for her first postoperative visit having undergone minimally invasive parathyroidectomy. Followup serum calcium level on June 12, 2011 is normal at 9.5.  Final pathology showed a parathyroid gland consistent with parathyroid adenoma weighing 500 mg and measuring 1 cm in maximum dimension.  EXAM: Surgical wound is well-healed. No soft tissue swelling. Voice quality is normal. No sign of infection.  IMPRESSION: Status post minimally invasive parathyroidectomy with normalization of serum calcium level  PLAN: Patient will continue to apply topical creams to her incision. We will check an intact PTH level and serum calcium level in 5 weeks. Patient will return for final wound check in 6 weeks.  Velora Heckler, MD, FACS General & Endocrine Surgery [facility]

## 2011-06-16 NOTE — Patient Instructions (Signed)
  COCOA BUTTER & VITAMIN E CREAM  ([employer] or other brand)  Apply cocoa butter/vitamin E cream to your incision 2 - 3 times daily.  Massage cream into incision for one minute with each application.  Use sunscreen (50 SPF or higher) for first 6 months after surgery if area is exposed to sun.  You may substitute Mederma or other scar reducing creams as desired.   

## 2011-06-17 ENCOUNTER — Other Ambulatory Visit (INDEPENDENT_AMBULATORY_CARE_PROVIDER_SITE_OTHER): Payer: Self-pay

## 2011-06-17 DIAGNOSIS — E210 Primary hyperparathyroidism: Secondary | ICD-10-CM

## 2011-07-31 ENCOUNTER — Telehealth (INDEPENDENT_AMBULATORY_CARE_PROVIDER_SITE_OTHER): Payer: Self-pay

## 2011-07-31 NOTE — Telephone Encounter (Signed)
I CALLED PT TO BE SURE SHE HAD LABS DONE PRIOR TO OV. PT OOT R/S OV BUT WILL GET LABS WEEK OF 5-6 WHEN RETURNS TO TOWN.

## 2011-08-04 ENCOUNTER — Encounter (INDEPENDENT_AMBULATORY_CARE_PROVIDER_SITE_OTHER): Payer: BC Managed Care – PPO | Admitting: Surgery

## 2011-08-12 ENCOUNTER — Encounter (INDEPENDENT_AMBULATORY_CARE_PROVIDER_SITE_OTHER): Payer: Self-pay | Admitting: Surgery

## 2011-08-12 ENCOUNTER — Ambulatory Visit (INDEPENDENT_AMBULATORY_CARE_PROVIDER_SITE_OTHER): Payer: BC Managed Care – PPO | Admitting: Surgery

## 2011-08-12 VITALS — BP 126/98 | HR 86 | Temp 98.7°F | Resp 14 | Ht 67.0 in | Wt 158.4 lb

## 2011-08-12 DIAGNOSIS — E210 Primary hyperparathyroidism: Secondary | ICD-10-CM

## 2011-08-12 NOTE — Patient Instructions (Signed)
  COCOA BUTTER & VITAMIN E CREAM  ([employer] or other brand)  Apply cocoa butter/vitamin E cream to your incision 2 - 3 times daily.  Massage cream into incision for one minute with each application.  Use sunscreen (50 SPF or higher) for first 6 months after surgery if area is exposed to sun.  You may substitute Mederma or other scar reducing creams as desired.   

## 2011-08-12 NOTE — Progress Notes (Signed)
Visit Diagnoses: 1. Hyperparathyroidism, primary     HISTORY: Patient returns for postoperative visit having undergone minimally invasive parathyroidectomy. Her postoperative calcium level normalized at 9.5. In May 2013 and intact PTH level was checked and is low normal at 18.  EXAM: Surgical wound is well healed with good cosmetic result. No sign of infection. No sign of seroma. Voice quality is normal.  IMPRESSION: Status post minimally invasive parathyroidectomy with normalization of serum calcium and intact PTH levels  PLAN: Patient will continue to apply topical creams to her incision. She will followup with her endocrinologist the summer. She will return to see me as needed.  Velora Heckler, MD, FACS General & Endocrine Surgery [facility]

## 2011-08-13 ENCOUNTER — Encounter (INDEPENDENT_AMBULATORY_CARE_PROVIDER_SITE_OTHER): Payer: Self-pay

## 2011-08-30 HISTORY — PX: OTHER SURGICAL HISTORY: SHX169

## 2011-09-22 ENCOUNTER — Other Ambulatory Visit: Payer: Self-pay | Admitting: Cardiology

## 2011-09-22 ENCOUNTER — Ambulatory Visit
Admission: RE | Admit: 2011-09-22 | Discharge: 2011-09-22 | Disposition: A | Discharge status: Home / Self Care | Payer: BC Managed Care – PPO | Source: Ambulatory Visit | Attending: Cardiology | Admitting: Cardiology

## 2011-09-22 DIAGNOSIS — R0989 Other specified symptoms and signs involving the circulatory and respiratory systems: Secondary | ICD-10-CM

## 2011-09-22 DIAGNOSIS — R0602 Shortness of breath: Secondary | ICD-10-CM

## 2011-09-23 LAB — PULMONARY FUNCTION TEST

## 2011-09-24 ENCOUNTER — Encounter (INDEPENDENT_AMBULATORY_CARE_PROVIDER_SITE_OTHER): Payer: Self-pay

## 2011-09-26 ENCOUNTER — Encounter (INDEPENDENT_AMBULATORY_CARE_PROVIDER_SITE_OTHER): Payer: Self-pay

## 2011-10-27 ENCOUNTER — Institutional Professional Consult (permissible substitution): Payer: BC Managed Care – PPO | Admitting: Internal Medicine

## 2011-11-04 ENCOUNTER — Encounter: Payer: Self-pay | Admitting: Pulmonary Disease

## 2011-11-05 ENCOUNTER — Ambulatory Visit (INDEPENDENT_AMBULATORY_CARE_PROVIDER_SITE_OTHER): Payer: BC Managed Care – PPO | Admitting: Pulmonary Disease

## 2011-11-05 ENCOUNTER — Other Ambulatory Visit: Payer: BC Managed Care – PPO

## 2011-11-05 ENCOUNTER — Encounter: Payer: Self-pay | Admitting: Pulmonary Disease

## 2011-11-05 VITALS — BP 102/70 | HR 80 | Temp 97.8°F | Ht 66.0 in | Wt 156.0 lb

## 2011-11-05 DIAGNOSIS — J45909 Unspecified asthma, uncomplicated: Secondary | ICD-10-CM

## 2011-11-05 DIAGNOSIS — J449 Chronic obstructive pulmonary disease, unspecified: Secondary | ICD-10-CM | POA: Insufficient documentation

## 2011-11-05 NOTE — Progress Notes (Signed)
  Subjective:    Patient ID: Kim Herman, female    DOB: 05/16/1950, 61 y.o.   MRN: [id number]  HPI The patient is a 61 year old female who I've been asked to see for dyspnea on exertion.  The patient notes her breathing issues began last October when on a trip to the mountains, and feel that it has progressed since that time.  The patient has a history of coronary disease, and underwent a stress test that was deemed normal.  The patient was taken off her beta blocker, but continued to have dyspnea on exertion.  She currently describes a less than one block dyspnea on exertion and a moderate level on flat ground.  She will also get winded walking up one flight of stairs, or making a bed.  She will get short of breath bringing groceries in from the car.  She denies any lower extremity edema.  She also has a cough at times with scant discolored mucus, and occasional wheezing that is audible and therefore most consistent with pseudo wheezing.  The patient has a history of DVT in 2006, and recently broke her foot for which she has been in an immobilizer for the last 2 weeks.  She denies any history of asthma, but does have exposure to secondhand smoke.  She has had a recent chest x-ray which showed hyperinflation but no acute process.   Review of Systems  Constitutional: Negative for fever and unexpected weight change.  HENT: Positive for sneezing. Negative for ear pain, nosebleeds, congestion, sore throat, rhinorrhea, trouble swallowing, dental problem, postnasal drip and sinus pressure.   Eyes: Negative for redness and itching.  Respiratory: Positive for cough, chest tightness, shortness of breath and wheezing.   Cardiovascular: Negative for palpitations and leg swelling.  Gastrointestinal: Negative for nausea and vomiting.  Genitourinary: Negative for dysuria.  Musculoskeletal: Negative for joint swelling.  Skin: Negative for rash.  Neurological: Negative for headaches.  Hematological: Does not  bruise/bleed easily.  Psychiatric/Behavioral: Negative for dysphoric mood. The patient is nervous/anxious.   All other systems reviewed and are negative.       Objective:   Physical Exam Constitutional:  Well developed, no acute distress  HENT:  Nares patent without discharge  Oropharynx without exudate, palate and uvula are normal  Eyes:  Perrla, eomi, no scleral icterus  Neck:  No JVD, no TMG  Cardiovascular:  Normal rate, regular rhythm, no rubs or gallops.  No murmurs        Intact distal pulses  Pulmonary :  Normal breath sounds, no stridor or respiratory distress   No rales, rhonchi, or wheezing  Abdominal:  Soft, nondistended, bowel sounds present.  No tenderness noted.   Musculoskeletal:  No lower extremity edema noted.  Lymph Nodes:  No cervical lymphadenopathy noted  Skin:  No cyanosis noted  Neurologic:  Alert, appropriate, moves all 4 extremities without obvious deficit.         Assessment & Plan:

## 2011-11-05 NOTE — Patient Instructions (Addendum)
Will start on dulera 100/5 2 inhalations am and pm everyday no matter how you feel.  Rinse mouth WELL, and spit out. Will check bloodwork today to evaluate for a hereditary form of emphysema.  Will call you with results. Will see you back in 4 weeks to check on your progress.

## 2011-11-05 NOTE — Assessment & Plan Note (Signed)
The patient has been found to have mild to moderate airflow obstruction by recent PFTs, and based on her history, this is most consistent with asthma.  It is unclear how much of her airflow limitation is chronic, and how much is potentially reversible with appropriate treatment.  I cannot rule out the possibility of COPD, and we'll therefore check an alpha one antitrypsin level for completeness since she has never smoked.  We'll start the patient on a LABA/ICS, and see how she responds.  The patient's symptoms of dyspnea on exertion are actually out of proportion to her degree of airflow obstruction.  The patient understands there may be more going on here than just her obstructive lung disease.

## 2011-11-10 ENCOUNTER — Telehealth: Payer: Self-pay | Admitting: Pulmonary Disease

## 2011-11-10 NOTE — Telephone Encounter (Signed)
[email or web address] x1 

## 2011-11-11 LAB — ALPHA-1 ANTITRYPSIN PHENOTYPE

## 2011-11-11 NOTE — Telephone Encounter (Signed)
Not back yet. 

## 2011-11-11 NOTE — Telephone Encounter (Signed)
I spoke with pt and is aware of this. She voiced her understanding and neeed nothing further

## 2011-11-11 NOTE — Telephone Encounter (Signed)
Kim Fiscal, do you know how long it takes for alpha 1 results to come back from the lab downstairs?

## 2011-11-11 NOTE — Telephone Encounter (Signed)
Called, spoke with pt.  She had lab done on 11/05/11 for alpha 1 -- would like to know if these results are back yet.  In the chart, looks like these are still in process.  Dr. Shelle Iron, pls advise.  Thank you.

## 2011-11-11 NOTE — Telephone Encounter (Signed)
Pt returned call. Kim Herman  

## 2011-11-11 NOTE — Telephone Encounter (Signed)
This usually takes 7-10 days per [facility].

## 2011-12-03 ENCOUNTER — Ambulatory Visit: Payer: BC Managed Care – PPO | Admitting: Pulmonary Disease

## 2011-12-03 ENCOUNTER — Encounter: Payer: Self-pay | Admitting: Pulmonary Disease

## 2011-12-03 ENCOUNTER — Ambulatory Visit (INDEPENDENT_AMBULATORY_CARE_PROVIDER_SITE_OTHER): Payer: BC Managed Care – PPO | Admitting: Pulmonary Disease

## 2011-12-03 VITALS — BP 112/70 | HR 80 | Temp 98.0°F | Ht 66.0 in | Wt 160.6 lb

## 2011-12-03 DIAGNOSIS — J45909 Unspecified asthma, uncomplicated: Secondary | ICD-10-CM

## 2011-12-03 MED ORDER — MOMETASONE FURO-FORMOTEROL FUM 100-5 MCG/ACT IN AERO: 2.0000 | INHALATION_SPRAY | Freq: Two times a day (BID) | RESPIRATORY_TRACT | Status: DC

## 2011-12-03 NOTE — Assessment & Plan Note (Signed)
The patient has seen some improvement in her dyspnea on exertion, as well as some of her airway symptoms.  I have reiterated to her again that it is unclear how much of her airflow obstruction is fixed versus reversible.  I would like to continue her on the dulera, and give her more time.  I also stressed to her the dulera helps prevent acute exacerbations, and can also reduce or prevent progressive airflow obstruction.  I have also asked her to work aggressively on weight loss and conditioning, since this also plays a role in her perceived dyspnea.  I also think she has some upper airway issues based on her cough and repeated throat clearing during our visit.  I have asked her to increase her proton pump inhibitor for the next 4 weeks as well.

## 2011-12-03 NOTE — Progress Notes (Signed)
  Subjective:    Patient ID: Kim Herman, female    DOB: 04/25/1950, 61 y.o.   MRN: [id number]  HPI The patient comes in today for followup of her dyspnea on exertion.  She has known airflow obstruction, and was started on dulera at the last visit.  She has seen some improvement in her breathing, as well as a decrease in some of her airway symptoms.  She was expecting a significant increase in her breathing, and is a little frustrated at this time.  I have reminded her that it has only been 4 weeks, and that it is unclear how much of her airflow obstruction is fixed versus reversible.  We had checked an alpha-1 antitrypsin level, and this was normal.  The patient denies any lower extremity edema, calf tenderness, or other symptoms to suggest thromboembolic disease.  She does have a history of coronary disease, but has had a stress test that apparently was unremarkable.   Review of Systems  Constitutional: Negative for fever and unexpected weight change.  HENT: Negative for ear pain, nosebleeds, congestion, sore throat, rhinorrhea, sneezing, trouble swallowing, dental problem, postnasal drip and sinus pressure.   Eyes: Negative for redness and itching.  Respiratory: Positive for cough, chest tightness and shortness of breath. Negative for wheezing.   Cardiovascular: Positive for palpitations. Negative for leg swelling.  Gastrointestinal: Negative for nausea and vomiting.  Genitourinary: Negative for dysuria.  Musculoskeletal: Negative for joint swelling.  Skin: Negative for rash.  Neurological: Negative for headaches.  Hematological: Does not bruise/bleed easily.  Psychiatric/Behavioral: Positive for dysphoric mood. The patient is nervous/anxious.        Objective:   Physical Exam Overweight female in no acute distress Nose without purulence or discharge noted Chest totally clear to auscultation, no wheezing Cardiac exam is regular rate and rhythm Lower extremities without edema, no  cyanosis Alert and oriented, moves all 4 extremities.       Assessment & Plan:

## 2011-12-03 NOTE — Patient Instructions (Addendum)
Try taking your omeprazole (acid reflux med) in am and pm for next 4 weeks. Stay on dulera everyday, keep mouth rinsed. Work on increased exercise to improve your endurance and conditioning. Please call me in 4 weeks with how things are going, and will arrange followup at that time.

## 2012-01-01 ENCOUNTER — Telehealth: Payer: Self-pay | Admitting: Pulmonary Disease

## 2012-01-01 NOTE — Telephone Encounter (Signed)
I spoke with the pt and she is calling to give an update on how she is doing on dulera. Pt states SOB with exertion is improved. She denies any wheezing or chest tightness. Pt states she has been having slight increase in cough over the last few days but she attributes this to allergies and does not feel it is due to dulera. She states she is taking the omeprazole twice daily as directed. Carron Curie, CMA

## 2012-01-02 NOTE — Telephone Encounter (Signed)
Phone note close in error.  Will forward to [facility].

## 2012-01-02 NOTE — Telephone Encounter (Signed)
Called spoke with patient, advised to continue the dulera 100-52mcg as directed by [facility].  Pt okay with these recs and verbalized her understanding.  Pt stated that she is unable to tell if increasing the prilosec 40mg  to bid has helped her respiratory symptoms, so advised she may decrease this back to once daily.  Pt aware that if her cough/symptoms return, to increase the prilosec back to twice daily.  Pt also reported that she is NOT taking an antihistamine regularly for her allergies.  She has taken Sudafed 3-4x when she would go and was feeling bad w/ runny nose or sneezing.  Follow up w/ [facility] scheduled for 2.3.14 @ 1:45pm.  Dr Shelle Iron, what antihistamine do you recommend pt begin taking regularly?  Pt did inform me that she does "like to take medicine."  Thanks.

## 2012-01-02 NOTE — Telephone Encounter (Signed)
Either plain zyrtec at bedtime, or chlorpheniramine 4mg  1-2 at bedtime.  As needed.

## 2012-01-02 NOTE — Telephone Encounter (Signed)
Please let her know to stay on dulera Did she see a difference in her cough/breathing with increasing her acid reflux med?  If so, continue twice a day.  If not, go back to once a day.  If she goes to once a day and cough/symptoms worsen, go back to twice a day.  Make sure she is taking an antihistamine for her allergies.  She needs ov with me in 4 mos.

## 2012-01-02 NOTE — Telephone Encounter (Signed)
Pt is aware of KC recs and verbalized understanding.

## 2012-05-03 ENCOUNTER — Encounter: Payer: Self-pay | Admitting: Pulmonary Disease

## 2012-05-03 ENCOUNTER — Ambulatory Visit (INDEPENDENT_AMBULATORY_CARE_PROVIDER_SITE_OTHER): Payer: BC Managed Care – PPO | Admitting: Pulmonary Disease

## 2012-05-03 VITALS — BP 102/70 | HR 72 | Temp 98.0°F | Ht 66.0 in | Wt 169.0 lb

## 2012-05-03 DIAGNOSIS — J45909 Unspecified asthma, uncomplicated: Secondary | ICD-10-CM

## 2012-05-03 NOTE — Assessment & Plan Note (Addendum)
The patient has been compliant on dulera, but does not feel that it has made a big difference to her exertional tolerance.  I have explained to her that it is an excellent medication, and that failure to respond usually means she has a fixed component of airflow obstruction that will not improve, or her shortness of breath is due to other factors such as weight, deconditioning, or possibly cardiac disease.  I have stressed to her the importance of staying on a maintenance medication in order to prevent acute exacerbations, and also to prevent further airway remodeling over time that results in progressive airflow obstruction.  I encouraged her to take the next few months to work aggressively on weight loss and conditioning while staying on her maintenance medication. My only other thought is the possibility of chronic TE disease.  She has a h/o DVT in the past, but nothing to suggest this being an ongoing issue.  If she continues to have doe, would consider v/q scan for completeness, but think this is unlikely.  She continues to have ongoing throat clearing that I explained has nothing to do with her asthma, but rather an upper airway issue.  The common sources include postnasal drip, reflux disease, progressive irritation from constant throat clearing.  She may benefit from a GI consultation, or possibly an airway exam by otolaryngology.  I will leave that to her primary care physician.

## 2012-05-03 NOTE — Progress Notes (Signed)
  Subjective:    Patient ID: Kim Herman, female    DOB: 1951-03-10, 62 y.o.   MRN: [id number]  HPI The patient comes in today for followup of her known asthma, with probable fixed component.  She has been on delivery since the last visit, and continues to have some degree of dyspnea on exertion.  She only saw some mild improvement being on this inhaler.  He denies any significant cough, but continues to have a tickle in her throat with constant throat clearing.  She did increase her proton pump inhibitor dosing for a few weeks, and did not see a difference.   Review of Systems  Constitutional: Positive for unexpected weight change ( increased 9lb since 11/2011). Negative for fever.  HENT: Negative for ear pain, nosebleeds, congestion, sore throat, rhinorrhea, sneezing, trouble swallowing, dental problem, postnasal drip and sinus pressure.   Eyes: Negative for redness and itching.  Respiratory: Negative for cough, chest tightness, shortness of breath and wheezing.   Cardiovascular: Negative for palpitations and leg swelling.  Gastrointestinal: Negative for nausea and vomiting.  Genitourinary: Negative for dysuria.  Musculoskeletal: Negative for joint swelling.  Skin: Negative for rash.  Neurological: Negative for headaches.  Hematological: Does not bruise/bleed easily.  Psychiatric/Behavioral: Negative for dysphoric mood. The patient is not nervous/anxious.        Objective:   Physical Exam Overweight female in no acute distress Nose without purulent discharge noted Neck without lymphadenopathy or thyromegaly Chest totally clear to auscultation Heart exam with regular rate and rhythm Lower extremities without edema, no cyanosis Alert and oriented, moves all 4 extremities.       Assessment & Plan:

## 2012-05-03 NOTE — Patient Instructions (Addendum)
Stay on dulera twice a day, and rinse your mouth well after using.  Can change you to symbicort if it is less expensive on your insurance plan. Work on weight reduction and conditioning for the next few months to see if your breathing improves. If your throat clearing continues, would discuss with your primary seeing a GI specialist for further evaluation. If doing well, followup with me in 6mos.

## 2012-05-05 ENCOUNTER — Telehealth: Payer: Self-pay | Admitting: Pulmonary Disease

## 2012-05-05 NOTE — Telephone Encounter (Signed)
Pt states Dulera needs reviewed by insurance for coverage. She has never tried any other inhalers in the past. Will call pt's insurance in the morning to see what is needed.

## 2012-05-05 NOTE — Telephone Encounter (Signed)
Pt returned triage's call.  Holly D Pryor ° °

## 2012-05-05 NOTE — Telephone Encounter (Signed)
lmomtcb  

## 2012-05-06 MED ORDER — MOMETASONE FURO-FORMOTEROL FUM 100-5 MCG/ACT IN AERO: 2.0000 | INHALATION_SPRAY | Freq: Two times a day (BID) | RESPIRATORY_TRACT | Status: DC

## 2012-05-06 NOTE — Telephone Encounter (Signed)
Kim Herman has been APPROVED from 04-22-12 through 05/06/13. Pt is aware and wants a new rx sent to [facility] for a 3 month supply.

## 2012-06-22 ENCOUNTER — Telehealth: Payer: Self-pay | Admitting: Pulmonary Disease

## 2012-06-22 NOTE — Telephone Encounter (Signed)
Called spoke with patient, discussed KC's recs as stated below.  Dealing with the first 2 issues, pt agreed with [facility].  Felt that if a peak flow or rescue inhaler were appropriate, they would have been prescribed and she reported that she is fine to continue the [facility].  Also stated that she had thought previously that the exertional dyspnea could be related to her "blockages" and agreed to discuss this with her cardiologist.  Regarding with the mucus production, pt stated that she produces "pinky nail sized" mucus.  She stated that she has had an issue with PND in the past, but doesn't necessarily feel that is where this is coming from.  KC please advise, thanks!

## 2012-06-22 NOTE — Telephone Encounter (Signed)
Since it is such a small quantity of mucus, would like to start with treatment for postnasal drip to see if improves.  Get chlorpheniramine 4mg  OTC, and take 2 at bedtime for next few weeks to see if helps.

## 2012-06-22 NOTE — Telephone Encounter (Signed)
Spoke with patient made her aware of recs as listed below per Dr. Shelle Iron. Patient verbalized understanding and nothing further needed at this time.

## 2012-06-22 NOTE — Telephone Encounter (Signed)
Pt last seen by [facility] 2.3.14 Called spoke with patient who reported that with her insurance this year, she has been assigned a nurse who suggested she call with a few issues:  1- pt has been having some leg and toe cramps.  She mentioned this to her PCP who didn't seem concerned.  However, she did notice that this is a documented side effect of the dulera.  She has noticed that the symptoms began around the time she began the dulera, but did not "make the connection" until just recently. 2- at last ov, it was recommended that she try to increase her exercise and "push herself."  However, this is difficult for her d/t SOB.  Pt's nurse recommended a peak flow meter and to use a rescue inhaler prior to exercising.  Thoughts? 3- pt c/o prod cough with yellow mucus x1 month.  She is unsure if this is related to her asthma as it is aggravated w/ exercise and stress.  Denies wheezing, tightness in chest, increased SOB (outside of exercising), head congestion and f/c/s.  [facility] Allergies  Allergen Reactions  . Codeine Nausea Only  . Sulfa Antibiotics Itching    Hands only, per patient. Happened in childhood.   Dr Shelle Iron please advise, thanks!

## 2012-06-22 NOTE — Telephone Encounter (Signed)
1) I have a lot of pts on this type of medication, and this is not something that I hear as a side effect.  That being said, everyone is different.  If she would like to try a different medication can do that, but will be in same family.  I suspect is not causing her symptoms, but will do what she thinks is best. 2) peak flow meters are not very helpful in pt's with fixed airflow obstruction, since they will always be abnormal.  Also, the pt does not have sudden onset of attacks that send her to the ER/hospital, and therefore feel it would be a waste of time for her.  If she wants to do them, by all means can arrange.  If she is on a bronchodilator in dulera that lasts 12 hrs and she takes twice a day, do not want her to use a rescue inhaler before exercise.  Her shortness of breath with exercise probably has very little to do with her asthma.  I suspect is more of an issue with weight and conditioning, and cannot exclude the possibility of a cardiac component (could address with primary md)  3) is she filling up kleenexes, or is she bringing up fingernail sized mucus.  ?coming from postnasal drip?

## 2012-08-21 ENCOUNTER — Encounter: Payer: Self-pay | Admitting: Cardiology

## 2012-08-24 ENCOUNTER — Ambulatory Visit (INDEPENDENT_AMBULATORY_CARE_PROVIDER_SITE_OTHER): Payer: BC Managed Care – PPO | Admitting: Cardiology

## 2012-08-24 ENCOUNTER — Encounter: Payer: Self-pay | Admitting: Cardiology

## 2012-08-24 VITALS — BP 110/76 | HR 68 | Ht 66.0 in | Wt 166.0 lb

## 2012-08-24 DIAGNOSIS — J4520 Mild intermittent asthma, uncomplicated: Secondary | ICD-10-CM

## 2012-08-24 DIAGNOSIS — J45909 Unspecified asthma, uncomplicated: Secondary | ICD-10-CM

## 2012-08-24 DIAGNOSIS — E785 Hyperlipidemia, unspecified: Secondary | ICD-10-CM

## 2012-08-24 DIAGNOSIS — I2510 Atherosclerotic heart disease of native coronary artery without angina pectoris: Secondary | ICD-10-CM

## 2012-08-24 NOTE — Patient Instructions (Addendum)
A physician think that you're doing relatively well from a cardiac standpoint. Please continue to monitor for signs of chest discomfort or worsening shortness of breath with exertion. We are not making and medical changes at this particular time.  As we discussed, would recommend discussing with the primary physician options for her converting from Prempro to other therapies for urogynecologic issues.  Otherwise your blood pressure and heart rate remained well-controlled on current medications. We'll simply followup in 1 year as discussed.

## 2012-08-24 NOTE — Progress Notes (Signed)
Patient ID: Kim Herman, female   DOB: [birth year], 62 y.o.   MRN: [id number] [facility]   Clinic Note: HPI: TAMMERA Herman is a 62 y.o. female with a PMH of unstable angina status post PCI to severe 99% RCA lesion treated with overlapping Promus DES stents in December 2011  presents today for routine followup.  Interval History: She is feeling quite well without any really significant complaints. She still has her exertional dyspnea there is no change from before been pretty much consistent. This is a was evaluated before with a CPET test that showed a VO2 score of 91% but relatively significant obstructive pulmonary disease is now followed by Dr. Shelle Iron. Unfortunately she's not been getting as much help as before with Dulera. She doesn't have any wheezing but has been diagnosed with asthma which is probably chronic. She denies any chest pain with rest or exertion. No dyspnea with rest. No PND, orthopnea, edema. No palpitations, lightheadedness, dizziness, wooziness, or syncope/near-syncope. No TIA or amaurosis fugax symptoms. No melena, hematochezia, or hematuria. No claudication. No significant weight gain.  Past Medical History  Diagnosis Date  . CAD (coronary artery disease), native coronary artery     s/p RCA stents 12/11, with another artery with 30 and 40% blockage  . Hyperparathyroidism   . Vitamin D deficiency   . Osteoporosis     DEXA 12/2010  . Hypothyroidism   . Dyslipidemia, goal LDL below 70     Due to CAD  . Gastroesophageal reflux   . Shingles 3/11  . Depression   . Anxiety   . Hyperlipidemia   . Bruises easily   . Hypertension   . PONV (postoperative nausea and vomiting)   . Hypercalcemia     Prior cardiac evaluation and past surgical history: Past Surgical History  Procedure Laterality Date  . Cardiac stents  03/2011    RCA  . Tubal ligation  1992  . Parathyroidectomy  05/22/2011    Procedure: PARATHYROIDECTOMY;  Surgeon: Velora Heckler, MD;  Location: [facility];  Service: General;  Laterality: [name];  . Cardiac catheterization    . Coronary angioplasty  03/06/2011    Stenting of the proximal RCA with a 3.0 and a 3.5 Promus stent, followed by 3.5 post-dilation balloon.   . Doppler echocardiography  2012    2 D Echo with EF of 64%. Normal LV  size and function, normal diastolic function. No valvular lesions, just mild MR.   . Met/cpet  08/2011    showed mildly submaximal effort of 0.95 greater than 1.09 is maximal effort. Her peak V02 was 91%.   . Rest stress myoview   06/04/2011    Allergies  Allergen Reactions  . Codeine Nausea Only  . Sulfa Antibiotics Itching    Hands only, per patient. Happened in childhood.    Current Outpatient Prescriptions  Medication Sig Dispense Refill  . acyclovir (ZOVIRAX) 400 MG tablet Take 400 mg by mouth 2 (two) times daily as needed. Outbreak       . aspirin 81 MG tablet Take 81 mg by mouth daily.      . Cholecalciferol (VITAMIN D3) 2000 UNITS TABS Take 2,000 Units by mouth daily.       . Coenzyme Q10 (CO Q-10) 100 MG CAPS Take 100 mg by mouth daily.       Marland Kitchen escitalopram (LEXAPRO) 20 MG tablet Take 20 mg by mouth daily after breakfast.       .  estrogen, conjugated,-medroxyprogesterone (PREMPRO) 0.3-1.5 MG per tablet Take 1 tablet by mouth daily.      . fish oil-omega-3 fatty acids 1000 MG capsule Take 1,200 mg by mouth daily.       Marland Kitchen levothyroxine (SYNTHROID, LEVOTHROID) 75 MCG tablet Take 75 mcg by mouth at bedtime.       . mometasone-formoterol (DULERA) 100-5 MCG/ACT AERO Inhale 2 puffs into the lungs 2 (two) times daily.  3 Inhaler  1  . nitroGLYCERIN (NITROSTAT) 0.4 MG SL tablet Place 0.4 mg under the tongue every 5 (five) minutes as needed. Chest pain       . omeprazole (PRILOSEC) 20 MG capsule Take 20 mg by mouth daily.      . pravastatin (PRAVACHOL) 80 MG tablet Take 80 mg by mouth daily after breakfast.      . conjugated estrogens (PREMARIN) vaginal cream Place 1 g vaginally  once a week.       . [DISCONTINUED] metoprolol succinate (TOPROL-XL) 25 MG 24 hr tablet Take 12.5 mg by mouth daily after breakfast.        No current facility-administered medications for this visit.    History   Social History  . Marital Status: Married    Spouse Name: [name]    Number of Children: [name]  . Years of Education: [name]   Occupational History  . retired    Social History Main Topics  . Smoking status: Never Smoker   . Smokeless tobacco: Never Used  . Alcohol Use: 2.4 oz/week    4 Glasses of wine per week  . Drug Use: No  . Sexually Active: Not on file   Other Topics Concern  . Not on file   Social History Narrative  . No narrative on file   ROS: A comprehensive Review of Systems - Negative except Pertinent positives noted above and below Respiratory ROS: positive for - shortness of breath negative for - cough, sputum changes, stridor or wheezing Musculoskeletal ROS: positive for - Leg cramping especially with hyperflexing.  PHYSICAL EXAM BP 110/76  Pulse 68  Ht 5\' 6"  (1.676 m)  Wt 166 lb (75.297 kg)  BMI 26.81 kg/m2 General appearance: alert, cooperative, appears stated age, no distress and Pleasant mood and affect Neck: no adenopathy, no carotid bruit, no JVD, supple, symmetrical, trachea midline, thyroid not enlarged, symmetric, no tenderness/mass/nodules and HEENT: [place]/AT/MMM. Anicteric sclera Lungs: clear to auscultation bilaterally, normal percussion bilaterally and Nonlabored, and no wheezes rales or rhonchi Heart: regular rate and rhythm, S1, S2 normal, no murmur, click, rub or gallop Abdomen: soft, non-tender; bowel sounds normal; no masses,  no organomegaly Extremities: extremities normal, atraumatic, no cyanosis or edema  [id number] today: Yes Rate: 68 , Rhythm: Normal sinus;  otherwise normal ECG  ASSESSMENT: Relatively stable from a cardiac standpoint. No active angina symptoms or CHF symptoms.  Patient Active Problem List   Diagnosis Date  Noted  . Dyslipidemia, goal LDL below 70   . Atherosclerotic heart disease   . Asthma, intrinsic 11/05/2011  . Hyperparathyroidism, primary 04/16/2011   PLAN: Per problem list.  Followup: One year  Riven Beebe W, M.D., M.S. [facility] [address] [place], [place]  [address]  [phone] Pager # [phone] 08/25/2012 5:09 PM

## 2012-08-25 ENCOUNTER — Encounter: Payer: Self-pay | Admitting: Cardiology

## 2012-08-25 DIAGNOSIS — E785 Hyperlipidemia, unspecified: Secondary | ICD-10-CM | POA: Insufficient documentation

## 2012-08-25 NOTE — Assessment & Plan Note (Addendum)
This has been followed by her primary physician. Her cramping with Pravachol has improved dramatically with the addition of CoQ10.Marland Kitchen She has been having some cramping if she bends her leg behind her. I recommended she  Double her dose of CoQ10.  If her symptoms do not improve, we may want to consider switching her from Pravachol to a different medication as she is on high-dose.

## 2012-08-25 NOTE — Assessment & Plan Note (Addendum)
Stable with no significant anginal symptoms. She does have exertional dyspnea which she attributes to her chronic pulmonary disease. We did evaluate this with a CPET test which showed a peak of VO2 of 91% with despite her abnormal PFTs a pretty good result from a cardiac standpoint. She is on aspirin and statin as well as facial. I do not see that she is on a beta blocker or ACE inhibitor, but does not have significant hypertension. There were normal EF, we have decided to avoid medications if possible. I also did again discuss the concept of hormone replacement therapy in the possibility of this increased year cardiac risks. Chest he says at this particular point is not really helping as much as initially did with her gynecologic issues. She will discuss this with Dr. Cyndia Bent. If possible, and if not thought to be helpful, we may want to consider stopping hormone replacement therapy.

## 2012-08-25 NOTE — Assessment & Plan Note (Addendum)
Documented on PFTs during CPET/MET test. Being followed by Dr. Marcelyn Bruins. She really didn't get much help with the [facility].

## 2012-11-01 ENCOUNTER — Ambulatory Visit: Payer: BC Managed Care – PPO | Admitting: Pulmonary Disease

## 2012-11-08 ENCOUNTER — Ambulatory Visit: Payer: BC Managed Care – PPO | Admitting: Pulmonary Disease

## 2012-11-30 ENCOUNTER — Ambulatory Visit (INDEPENDENT_AMBULATORY_CARE_PROVIDER_SITE_OTHER): Payer: BC Managed Care – PPO | Admitting: Pulmonary Disease

## 2012-11-30 ENCOUNTER — Encounter: Payer: Self-pay | Admitting: Pulmonary Disease

## 2012-11-30 VITALS — BP 128/86 | HR 72 | Temp 97.9°F | Ht 66.5 in | Wt 166.2 lb

## 2012-11-30 DIAGNOSIS — J45909 Unspecified asthma, uncomplicated: Secondary | ICD-10-CM

## 2012-11-30 NOTE — Patient Instructions (Addendum)
Ok to stay off dulera for now. Will try albuterol inhaler 2 puffs up to every 6 hrs if needed for emergencies.  Can also be used 2 puffs about prior to exercise. If you feel your breathing is worsening, you need to call.  We will repeat your simple breathing test at next visit to make sure your flows are not worsening. followup with me in 6mos.   Call if you need prescription for albuterol.

## 2012-11-30 NOTE — Progress Notes (Signed)
  Subjective:    Patient ID: Kim Herman, female    DOB: May 30, 1950, 62 y.o.   MRN: [id number]  HPI Patient comes in today for followup of her known airflow obstruction, felt to be secondary to fixed asthma.  She saw no difference on her maintenance inhaler with delirium, and therefore discontinued the medication.  She doesn't feel that her breathing has been any worse since being off the medication.  She continues to have chronic dyspnea on exertion.  She really would prefer not being on the medication if it doesn't make a difference to her symptoms, but I have explained to her again the pathophysiology of airway inflammation and progressive airflow obstruction.   Review of Systems  Constitutional: Negative for fever and unexpected weight change.  HENT: Negative for ear pain, nosebleeds, congestion, sore throat, rhinorrhea, sneezing, trouble swallowing, dental problem, postnasal drip and sinus pressure.   Eyes: Negative for redness and itching.  Respiratory: Negative for cough, chest tightness, shortness of breath and wheezing.   Cardiovascular: Negative for palpitations and leg swelling.  Gastrointestinal: Negative for nausea and vomiting.  Genitourinary: Negative for dysuria.  Musculoskeletal: Negative for joint swelling.  Skin: Negative for rash.  Neurological: Negative for headaches.  Hematological: Does not bruise/bleed easily.  Psychiatric/Behavioral: Negative for dysphoric mood. The patient is not nervous/anxious.        Objective:   Physical Exam Overweight female in no acute distress Nose without purulence or discharge noted Neck without lymphadenopathy or thyromegaly Chest clear auscultation, no wheezing Cardiac exam with regular rate and rhythm Lower extremities without edema, no cyanosis Alert and oriented, moves all 4 extremities.       Assessment & Plan:

## 2012-11-30 NOTE — Assessment & Plan Note (Signed)
The pt has quit using her maintenance inhaler because she felt it did not make a difference in her breathing.  However, I remain concerned about progressive airflow obstruction related to ongoing airway inflammation.  She and I agreed to keep her off maintenance meds for now, but to use albuterol as needed.  Will need to keep a close eye on her flows, and will need to get back on maintenance med if she has continued reduction in flows over time.   She is agreeable to this approach.

## 2013-02-03 ENCOUNTER — Other Ambulatory Visit: Payer: Self-pay

## 2013-05-30 ENCOUNTER — Ambulatory Visit: Payer: BC Managed Care – PPO | Admitting: Pulmonary Disease

## 2013-06-20 ENCOUNTER — Ambulatory Visit (INDEPENDENT_AMBULATORY_CARE_PROVIDER_SITE_OTHER): Payer: BC Managed Care – PPO | Admitting: Pulmonary Disease

## 2013-06-20 ENCOUNTER — Encounter: Payer: Self-pay | Admitting: Pulmonary Disease

## 2013-06-20 VITALS — BP 110/86 | HR 73 | Temp 97.5°F | Ht 66.0 in | Wt 167.6 lb

## 2013-06-20 DIAGNOSIS — R0609 Other forms of dyspnea: Secondary | ICD-10-CM

## 2013-06-20 DIAGNOSIS — R0989 Other specified symptoms and signs involving the circulatory and respiratory systems: Secondary | ICD-10-CM

## 2013-06-20 DIAGNOSIS — J45909 Unspecified asthma, uncomplicated: Secondary | ICD-10-CM

## 2013-06-20 NOTE — Patient Instructions (Signed)
Will try spiriva respimat, 2 inhalations each am for next 2 weeks.  Please call and give me an update. Will send a note to your cardiologist regarding your significant shortness of breath. followup with me again in 54mos., but will stay in touch during your re-evaluation.

## 2013-06-20 NOTE — Assessment & Plan Note (Signed)
The patient saw no difference in her breathing after being on a trial of dulera, despite being compliant with therapy. She now is having dyspnea on exertion that is significantly impacting her quality of life. Her lungs are totally clear today, and I am concerned that her symptoms may not be secondary to her obstructive lung disease. I will try her on anti-cholinergic therapy to see if she sees a difference.

## 2013-06-20 NOTE — Progress Notes (Signed)
   Subjective:    Patient ID: Kim Herman, female    DOB: 1950/09/13, 63 y.o.   MRN: [id number]  HPI Patient comes in today for followup of her known chronic obstructive asthma. She has been tried on a LABA/ICS, with no change in her breathing. Since the last visit, she has used albuterol alone as needed, but has not required the medication. She notes significant dyspnea on exertion which is now interfering with her quality of life. She describes shortness of breath even with mild to moderate types of activity. She has a mild cough with a globus sensation and throat clearing, and is probably related to postnasal drip. She has a history of coronary disease, but denies any chest discomfort.   Review of Systems  Constitutional: Negative for fever and unexpected weight change.  HENT: Negative for congestion, dental problem, ear pain, nosebleeds, postnasal drip, rhinorrhea, sinus pressure, sneezing, sore throat and trouble swallowing.   Eyes: Negative for redness and itching.  Respiratory: Positive for cough, shortness of breath and wheezing. Negative for chest tightness.   Cardiovascular: Negative for palpitations and leg swelling.  Gastrointestinal: Negative for nausea and vomiting.  Genitourinary: Negative for dysuria.  Musculoskeletal: Negative for joint swelling.  Skin: Negative for rash.  Neurological: Negative for headaches.  Hematological: Does not bruise/bleed easily.  Psychiatric/Behavioral: Negative for dysphoric mood. The patient is not nervous/anxious.        Objective:   Physical Exam Well-developed female in no acute distress Nose without purulence or discharge noted Neck without lymphadenopathy or thyromegaly Chest totally clear to auscultation, no wheezing Cardiac exam with regular rate and rhythm Lower extremities without edema, no cyanosis Alert and oriented, moves all 4 extremities.       Assessment & Plan:

## 2013-06-20 NOTE — Assessment & Plan Note (Signed)
The patient is having dyspnea on exertion which is significantly impacting her quality of life. Her symptoms seem out of proportion to her degree of lung disease, and I wonder if this may be related to her coronary disease. I will send a note to her cardiologist to consider this. In the meantime, I've asked her to continue working on weight loss and conditioning.

## 2013-08-04 ENCOUNTER — Encounter: Payer: Self-pay | Admitting: Cardiology

## 2013-08-04 ENCOUNTER — Ambulatory Visit (INDEPENDENT_AMBULATORY_CARE_PROVIDER_SITE_OTHER): Payer: BC Managed Care – PPO | Admitting: Cardiology

## 2013-08-04 VITALS — BP 112/82 | HR 72 | Ht 66.0 in | Wt 164.3 lb

## 2013-08-04 DIAGNOSIS — R0609 Other forms of dyspnea: Secondary | ICD-10-CM

## 2013-08-04 DIAGNOSIS — E785 Hyperlipidemia, unspecified: Secondary | ICD-10-CM

## 2013-08-04 DIAGNOSIS — R5381 Other malaise: Secondary | ICD-10-CM

## 2013-08-04 DIAGNOSIS — I2510 Atherosclerotic heart disease of native coronary artery without angina pectoris: Secondary | ICD-10-CM

## 2013-08-04 DIAGNOSIS — R5383 Other fatigue: Secondary | ICD-10-CM

## 2013-08-04 DIAGNOSIS — R0989 Other specified symptoms and signs involving the circulatory and respiratory systems: Secondary | ICD-10-CM

## 2013-08-04 MED ORDER — NITROGLYCERIN 0.4 MG SL SUBL: 0.4000 mg | SUBLINGUAL_TABLET | SUBLINGUAL | Status: DC | PRN

## 2013-08-04 NOTE — Patient Instructions (Addendum)
Your physician has requested that you have en exercise stress myoview. For further information please visit [email or web address]. Please follow instruction sheet, as given.   Your physician wants you to follow-up in  4-6 weeks  Dr Harding.(after test completed) You will receive a reminder letter in the mail two months in advance. If you don't receive a letter, please call our office to schedule the follow-up appointment.

## 2013-08-06 ENCOUNTER — Encounter: Payer: Self-pay | Admitting: Cardiology

## 2013-08-06 DIAGNOSIS — R5383 Other fatigue: Secondary | ICD-10-CM | POA: Insufficient documentation

## 2013-08-06 NOTE — Assessment & Plan Note (Signed)
No active anginal symptoms, however she is having significant exertional dyspnea. On statin, aspirin. Not on beta blocker or ACE inhibitor as her blood pressure is well-controlled without. Would be reluctant to use beta blockers or pulmonary disease. If anything would consider using a RV for a later blood pressure if necessary.

## 2013-08-06 NOTE — Assessment & Plan Note (Signed)
Were given her exertional dyspnea has definite impacting her quality of life and is out of proportion with her lung disease according to her pulmonologist. She is not noting any of her typical anginal symptoms, however we cannot exclude a cardiac etiology. It seems to have gotten worse since her last visit.  Plan: Exercise Cardiolite Nuclear Stress Test

## 2013-08-06 NOTE — Progress Notes (Signed)
Patient ID: Kim Herman, female   DOB: 1951/02/09, 63 y.o.   MRN: [id number] PCP: Chesley Noon, MD  Clinic Note: HPI: Kim Herman is a 63 y.o. female with a PMH of unstable angina status post PCI to severe 99% RCA lesion treated with overlapping Promus DES stents in December 2011 [facility] [place])  presents today for routine six-month followup. I last saw her in May of 2014. She was having mild exertional dyspnea that time with a relatively consistent. Reevaluate her with a CPET test showing a VO2 of 91% but with obstructive pulmonary disease. Unfortunately with Dr. Wellington Hampshire, she is not had any notable improvement with medical therapy. He is referred back to me for possible concerned she could have a cardiac component involved.  Interval History: She doesn't now that she really has a decreased quality of life because of her dyspnea. It is mostly exertional and at rest. She denied any chest pain associated with it. She says that she just feels tired and not having enough energy to do if anything. She doesn't have any insurance. Walking up steps or walk up a pill will make her extremely fatigued at work she had to stop. Sometimes she is dizzy she'll feel some tightness across her chest and dizziness. She does cough quite a bit but does not have a lot of wheezing unless she's not doing yardwork.   No PND, orthopnea, edema. No palpitations, lightheadedness, dizziness, wooziness, or syncope/near-syncope. No TIA or amaurosis fugax symptoms. No melena, hematochezia, or hematuria. No claudication. No significant weight gain.  Past Medical History  Diagnosis Date  . CAD (coronary artery disease), native coronary artery 02/2010    s/p RCA stents 12/11, with another artery with 30 and 40% blockage  . Hyperparathyroidism   . Vitamin D deficiency   . Osteoporosis     DEXA 12/2010  . Hypothyroidism   . Dyslipidemia, goal LDL below 70     Due to CAD  . Gastroesophageal reflux   . Shingles 3/11    . Depression   . Anxiety   . Hyperlipidemia   . Bruises easily   . Hypertension   . PONV (postoperative nausea and vomiting)   . Hypercalcemia    Prior cardiac evaluation and past surgical history: Past Surgical History  Procedure Laterality Date  . Cardiac stents  03/2011    RCA  . Tubal ligation  1992  . Parathyroidectomy  05/22/2011    Procedure: PARATHYROIDECTOMY;  Surgeon: Earnstine Regal, MD;  Location: [facility];  Service: General;  Laterality: [name];  . Cardiac catheterization    . Coronary angioplasty  03/06/2011    Stenting of the proximal RCA with a 3.0 and a 3.5 Promus stent, followed by 3.5 post-dilation balloon.   . Doppler echocardiography  2012    2 D Echo with EF of 64%. Normal LV  size and function, normal diastolic function. No valvular lesions, just mild MR.   . Met/cpet  08/2011    showed mildly submaximal effort of 0.95 greater than 1.09 is maximal effort. Her peak V02 was 91%.   . Rest stress myoview   06/04/2011    Allergies  Allergen Reactions  . Codeine Nausea Only  . Sulfa Antibiotics Itching    Hands only, per patient. Happened in childhood.    Current Outpatient Prescriptions  Medication Sig Dispense Refill  . acyclovir (ZOVIRAX) 400 MG tablet Take 400 mg by mouth 2 (two) times daily as needed. Outbreak       .  aspirin 81 MG tablet Take 81 mg by mouth daily.      . Cholecalciferol (VITAMIN D3) 2000 UNITS TABS Take 2,000 Units by mouth daily.       Marland Kitchen co-enzyme Q-10 30 MG capsule Take 30 mg by mouth daily.      Marland Kitchen escitalopram (LEXAPRO) 20 MG tablet Take 20 mg by mouth daily after breakfast.       . fish oil-omega-3 fatty acids 1000 MG capsule Take 1,200 mg by mouth daily.       Marland Kitchen levothyroxine (SYNTHROID, LEVOTHROID) 75 MCG tablet Take 75 mcg by mouth at bedtime.       . nitroGLYCERIN (NITROSTAT) 0.4 MG SL tablet Place 1 tablet (0.4 mg total) under the tongue every 5 (five) minutes as needed. Chest pain  25 tablet  6  . omeprazole (PRILOSEC) 40 MG capsule  Take 40 mg by mouth daily.      . rosuvastatin (CRESTOR) 10 MG tablet Take by mouth daily.      . [DISCONTINUED] metoprolol succinate (TOPROL-XL) 25 MG 24 hr tablet Take 12.5 mg by mouth daily after breakfast.        No current facility-administered medications for this visit.    History   Social History  . Marital Status: Married    Spouse Name: [name]    Number of Children: [name]  . Years of Education: [name]   Occupational History  . retired    Social History Main Topics  . Smoking status: Never Smoker   . Smokeless tobacco: Never Used  . Alcohol Use: 2.4 oz/week    4 Glasses of wine per week  . Drug Use: No  . Sexual Activity: Not on file   Other Topics Concern  . Not on file   Social History Narrative  . No narrative on file   ROS: A comprehensive Review of Systems - Negative except Pertinent positives noted above and below Respiratory ROS: positive for - shortness of breath negative for - cough, sputum changes, stridor or wheezing  PHYSICAL EXAM BP 112/82  Pulse 72  Ht _0  (1.676 m)  Wt 164 lb 4.8 oz (74.526 kg)  BMI 26.53 kg/m2 General appearance: alert, cooperative, appears stated age, no distress and Pleasant mood and affect Neck: supple, no adenopathy, no carotid bruit, no JVD HEENT: [place]/AT/MMM. Anicteric sclera Lungs: Mostly CTA B., normal percussion bilaterally and Nonlabored, and no wheezes rales or rhonchi Heart: RRR, S1, S2 normal, no murmur, click, rub or gallop Abdomen: soft, non-tender; bowel sounds normal; no masses,  no organomegaly Extremities: extremities normal, atraumatic, no cyanosis or edema Neuro: Grossly intact   [id number] today: Yes Rate: 68 , Rhythm: Normal sinus;  rightward axis at 93; normal intervals; otherwise normal ECG  ASSESSMENT/PLAN: Life-style limiting exertional dyspnea and fatigue.   DOE (dyspnea on exertion) Were given her exertional dyspnea has definite impacting her quality of life and is out of proportion with her  lung disease according to her pulmonologist. She is not noting any of her typical anginal symptoms, however we cannot exclude a cardiac etiology. It seems to have gotten worse since her last visit.  Plan: Exercise Cardiolite Nuclear Stress Test  CAD S/P percutaneous coronary angioplasty - PCI of RCA No active anginal symptoms, however she is having significant exertional dyspnea. On statin, aspirin. Not on beta blocker or ACE inhibitor as her blood pressure is well-controlled without. Would be reluctant to use beta blockers or pulmonary disease. If anything would consider using a RV for a  later blood pressure if necessary.  Dyslipidemia, goal LDL below 70 Monitored by PCP. On statin, omega-3 fish oil, and CoQ 10.  Fatigue Based on the fact that she did very well on her CPET test, I'm not convinced that this is cardiac in nature. However, Exercise Cardiolite stress test will help both with evaluation for her exercise tolerance as well as potential ischemia.   Meds ordered this encounter  Medications  . DISCONTD: nitroGLYCERIN (NITROSTAT) 0.4 MG SL tablet    Sig: Place 1 tablet (0.4 mg total) under the tongue every 5 (five) minutes as needed. Chest pain    Dispense:  25 tablet    Refill:  6  . nitroGLYCERIN (NITROSTAT) 0.4 MG SL tablet    Sig: Place 1 tablet (0.4 mg total) under the tongue every 5 (five) minutes as needed. Chest pain    Dispense:  25 tablet    Refill:  6   Orders Placed This Encounter  Procedures  . Myocardial Perfusion Imaging  . EKG 12-Lead     Followup: One year  Leonie Man, M.D., M.S. [facility] [address] [place], [place]  [address]  [phone] Pager # [phone] 08/06/2013 8:34 PM

## 2013-08-06 NOTE — Assessment & Plan Note (Signed)
Based on the fact that she did very well on her CPET test, I'm not convinced that this is cardiac in nature. However, Exercise Cardiolite stress test will help both with evaluation for her exercise tolerance as well as potential ischemia.

## 2013-08-06 NOTE — Assessment & Plan Note (Signed)
Monitored by PCP. On statin, omega-3 fish oil, and CoQ 10.

## 2013-08-10 ENCOUNTER — Telehealth (HOSPITAL_COMMUNITY): Payer: Self-pay

## 2013-08-12 ENCOUNTER — Ambulatory Visit (HOSPITAL_COMMUNITY)
Admission: RE | Admit: 2013-08-12 | Discharge: 2013-08-12 | Disposition: A | Discharge status: Home / Self Care | Payer: BC Managed Care – PPO | Source: Ambulatory Visit | Attending: Cardiovascular Disease | Admitting: Cardiovascular Disease

## 2013-08-12 ENCOUNTER — Telehealth: Payer: Self-pay | Admitting: *Deleted

## 2013-08-12 DIAGNOSIS — E785 Hyperlipidemia, unspecified: Secondary | ICD-10-CM | POA: Insufficient documentation

## 2013-08-12 DIAGNOSIS — R0609 Other forms of dyspnea: Secondary | ICD-10-CM | POA: Insufficient documentation

## 2013-08-12 DIAGNOSIS — R5383 Other fatigue: Secondary | ICD-10-CM

## 2013-08-12 DIAGNOSIS — R5381 Other malaise: Secondary | ICD-10-CM | POA: Insufficient documentation

## 2013-08-12 DIAGNOSIS — R0989 Other specified symptoms and signs involving the circulatory and respiratory systems: Secondary | ICD-10-CM | POA: Insufficient documentation

## 2013-08-12 DIAGNOSIS — I2510 Atherosclerotic heart disease of native coronary artery without angina pectoris: Secondary | ICD-10-CM | POA: Insufficient documentation

## 2013-08-12 HISTORY — PX: NM MYOVIEW LTD: HXRAD82

## 2013-08-12 MED ORDER — TECHNETIUM TC 99M SESTAMIBI GENERIC - CARDIOLITE
10.8000 | Freq: Once | INTRAVENOUS | Status: AC | PRN
  Administered 2013-08-12: 11 via INTRAVENOUS

## 2013-08-12 MED ORDER — AMINOPHYLLINE 25 MG/ML IV SOLN
75.0000 mg | Freq: Once | INTRAVENOUS | Status: AC
  Administered 2013-08-12: 75 mg via INTRAVENOUS

## 2013-08-12 MED ORDER — REGADENOSON 0.4 MG/5ML IV SOLN
0.4000 mg | Freq: Once | INTRAVENOUS | Status: AC
Start: 2013-08-12 — End: 2013-08-12
  Administered 2013-08-12: 0.4 mg via INTRAVENOUS

## 2013-08-12 MED ORDER — TECHNETIUM TC 99M SESTAMIBI GENERIC - CARDIOLITE
30.8000 | Freq: Once | INTRAVENOUS | Status: AC | PRN
  Administered 2013-08-12: 30.8 via INTRAVENOUS

## 2013-08-12 NOTE — Telephone Encounter (Signed)
Spoke to patient. Result given . Verbalized understanding  

## 2013-08-12 NOTE — Telephone Encounter (Signed)
Message copied by Raiford Simmonds on Fri Aug 12, 2013  2:57 PM ------      Message from: Leonie Man      Created: Fri Aug 12, 2013  1:49 PM       Stress Test looked good!! No sign of significant Heart Artery Disease.  Pump function is normal.            Good news!!.            Leonie Man, MD       ------

## 2013-08-12 NOTE — Procedures (Addendum)
[facility] [address] [place] [address] [phone]  Cardiology Nuclear Med Study  Kim Herman is a 63 y.o. female     MRN : [id number]     DOB: May 23, 1950  Procedure Date: 08/12/2013  Nuclear Med Background Indication for Stress Test:  Stent Patency History:  Asthma and CAD;STENT/PTCA-02/2010;Athersclerotic heart disease;Last NUC MPI on 06/04/2011-nonischemic;EF=85% Cardiac Risk Factors: Lipids  Symptoms:  Dizziness, DOE, Fatigue and Light-Headedness   Nuclear Pre-Procedure Caffeine/Decaff Intake:  7:00pm NPO After: 5:00am   IV Site: R Forearm  IV 0.9% NS with Angio Cath:  22g  Chest Size (in):  n/a IV Started by: Rolene Course , RN  Height: 5\' 6"  (1.676 m)  Cup Size: B  BMI:  Body mass index is 26.48 kg/(m^2). Weight:  164 lb (74.39 kg)   Tech Comments:  Patient was changed to [place] due to early onset of marked SOB and couldn't obtain needed heartrate    Nuclear Med Study 1 or 2 day study: 1 day  Stress Test Type:  Stress  Order Authorizing Provider:  Glenetta Hew, MD   Resting Radionuclide: Technetium 98m Sestamibi  Resting Radionuclide Dose: 10.8 mCi   Stress Radionuclide:  Technetium 9m Sestamibi  Stress Radionuclide Dose: 30.8 mCi           Stress Protocol Rest HR: 75 Stress HR: 95  Rest BP: 139/88 Stress BP: 149/82  Exercise Time (min): n/a METS: n/a   Predicted Max HR: 158 bpm % Max HR: 63.29 bpm Rate Pressure Product: 14900  Dose of Adenosine (mg):  n/a Dose of Lexiscan: 0.4 mg  Dose of Atropine (mg): n/a Dose of Dobutamine: n/a mcg/kg/min (at max HR)  Stress Test Technologist: Leane Para, CCT Nuclear Technologist: Imagene Riches, CNMT   Rest Procedure:  Myocardial perfusion imaging was performed at rest 45 minutes following the intravenous administration of Technetium 32m Sestamibi. Stress Procedure:  The patient received IV Lexiscan 0.4 mg over 15-seconds.  Technetium 62m Sestamibi  injected IV at 30-seconds.  Patient experienced SOB, Headache and Nausea and 75 mg of Aminophylline IV was administered at 5 minutes.  There were no significant changes with Lexiscan.  Quantitative spect images were obtained after a 45 minute delay.  Transient Ischemic Dilatation (Normal <1.22):  1.09 Lung/Heart Ratio (Normal <0.45):  0.27 QGS EDV:  46 ml QGS ESV:  10 ml LV Ejection Fraction: 78%  .    Rest ECG: NSR - Normal EKG  Stress ECG: No significant change from baseline ECG  QPS Raw Data Images:  Normal; no motion artifact; normal heart/lung ratio. Stress Images:  Normal homogeneous uptake in all areas of the myocardium. Rest Images:  Normal homogeneous uptake in all areas of the myocardium. Subtraction (SDS):  No evidence of ischemia. LV Wall Motion:  NL LV Function; NL Wall Motion  Impression Exercise Capacity:  Lexiscan with no exercise. BP Response:  Normal blood pressure response. Clinical Symptoms:  No significant symptoms noted. ECG Impression:  No significant ST segment change suggestive of ischemia. Comparison with Prior Nuclear Study: No images to compare   Overall Impression:  Normal stress nuclear study.   Sanda Klein, MD  08/12/2013 1:00 PM

## 2013-08-12 NOTE — Progress Notes (Signed)
Quick Note:  Stress Test looked good!! No sign of significant Heart Artery Disease. Pump function is normal.  Good news!!.  Kam Kushnir W, MD  ______ 

## 2013-08-26 IMAGING — CR DG CHEST 2V
2 series · 2 of 2 positions shown · non-contrast
Comparison: None.

CLINICAL DATA: Preop parathyroidectomy.

CHEST - 2 VIEW

[w chest pa *]
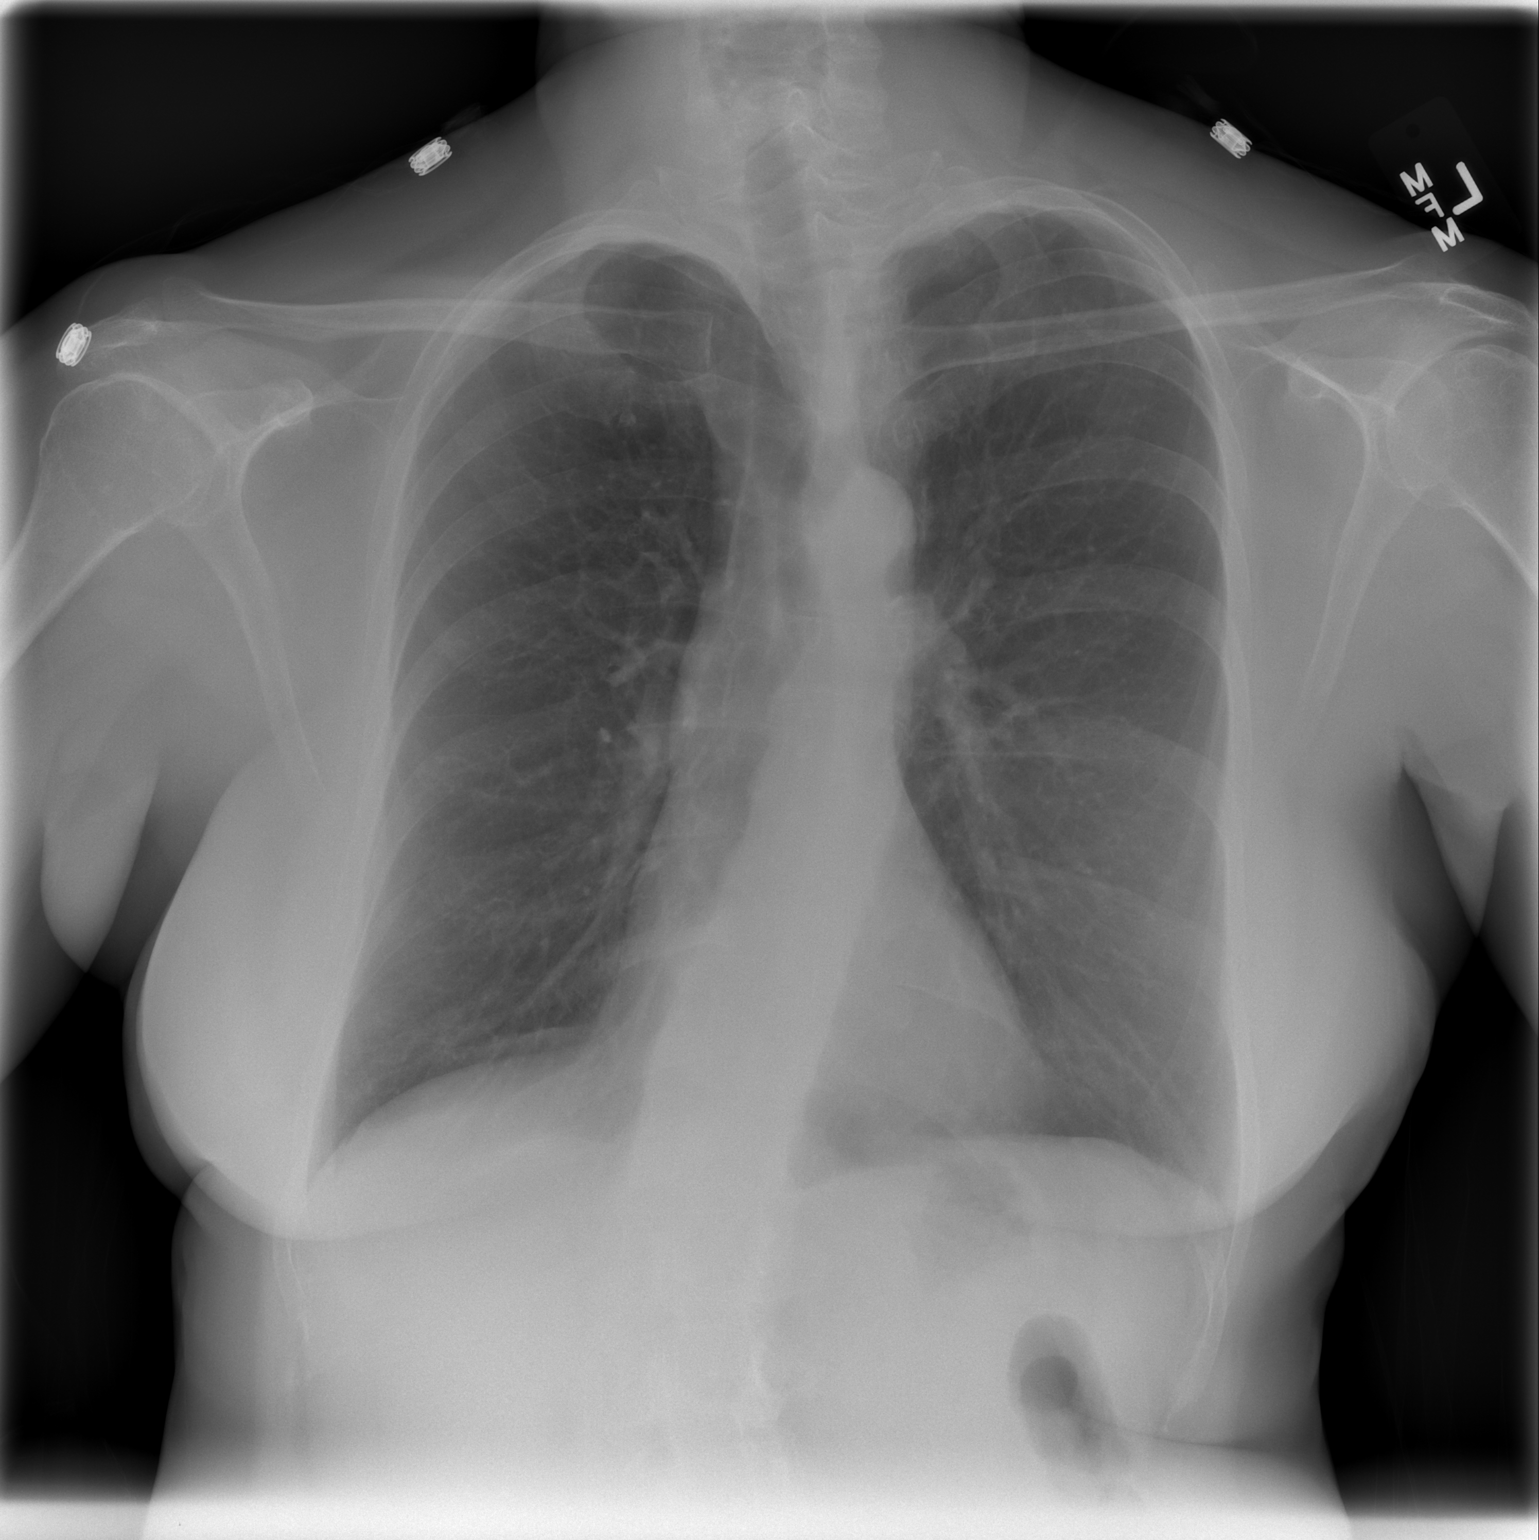

[w chest lat *]
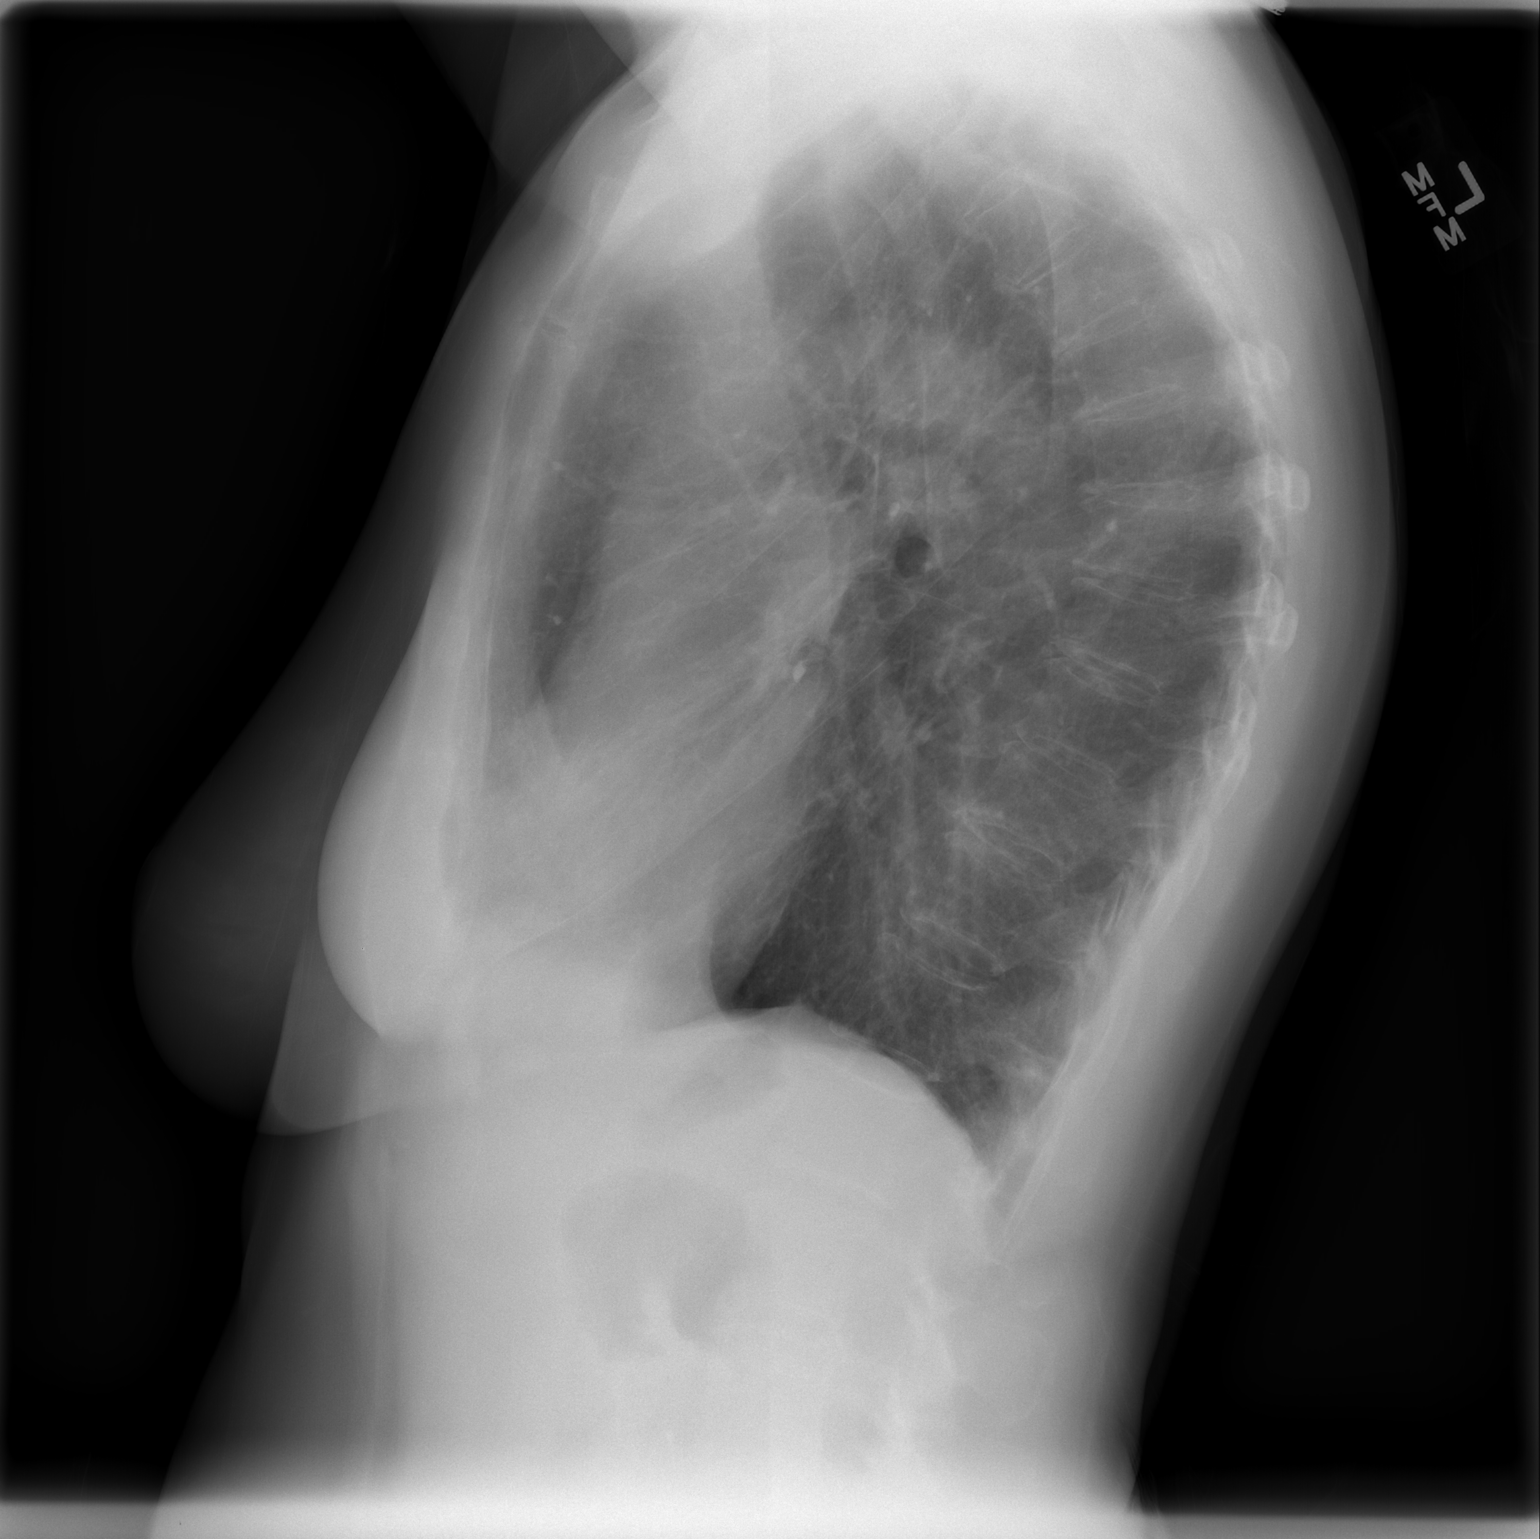

[2 of 2 positions shown; findings below may reference images not displayed]

FINDINGS: Heart size and vascularity are normal.  Lungs are clear.
Negative for infiltrate or mass lesion.  Scoliosis of the spine is
present.
IMPRESSION: No active cardiopulmonary disease.

## 2013-09-13 ENCOUNTER — Encounter: Payer: Self-pay | Admitting: Cardiology

## 2013-09-13 ENCOUNTER — Ambulatory Visit (INDEPENDENT_AMBULATORY_CARE_PROVIDER_SITE_OTHER): Payer: BC Managed Care – PPO | Admitting: Cardiology

## 2013-09-13 VITALS — BP 102/68 | HR 82 | Ht 66.0 in | Wt 164.0 lb

## 2013-09-13 DIAGNOSIS — R0989 Other specified symptoms and signs involving the circulatory and respiratory systems: Secondary | ICD-10-CM

## 2013-09-13 DIAGNOSIS — Z9861 Coronary angioplasty status: Secondary | ICD-10-CM

## 2013-09-13 DIAGNOSIS — J449 Chronic obstructive pulmonary disease, unspecified: Secondary | ICD-10-CM

## 2013-09-13 DIAGNOSIS — I2510 Atherosclerotic heart disease of native coronary artery without angina pectoris: Secondary | ICD-10-CM

## 2013-09-13 DIAGNOSIS — E785 Hyperlipidemia, unspecified: Secondary | ICD-10-CM

## 2013-09-13 DIAGNOSIS — R0609 Other forms of dyspnea: Secondary | ICD-10-CM

## 2013-09-13 MED ORDER — ISOSORBIDE MONONITRATE ER 30 MG PO TB24: 30.0000 mg | ORAL_TABLET | Freq: Every day | ORAL | Status: DC

## 2013-09-13 MED ORDER — RANOLAZINE ER 500 MG PO TB12: 500.0000 mg | ORAL_TABLET | Freq: Two times a day (BID) | ORAL | Status: DC

## 2013-09-13 NOTE — Patient Instructions (Addendum)
Your physician recommends that you schedule a follow-up appointment in: 2 Months  Your physician has requested that you have an echocardiogram. Echocardiography is a painless test that uses sound waves to create images of your heart. It provides your doctor with information about the size and shape of your heart and how well your heart's chambers and valves are working. This procedure takes approximately one hour. There are no restrictions for this procedure.  Your physician has recommended you make the following change in your medication: Start Ranexa 500 mg twice daily for 1 month for shortness of breath if it doesn't work start Isosorbide 30 mg daily in evening  2

## 2013-09-16 ENCOUNTER — Encounter: Payer: Self-pay | Admitting: Cardiology

## 2013-09-16 NOTE — Assessment & Plan Note (Signed)
Continue statin. Last check there was borderline control. Continue with omega-3 fish oil and co-Q10. Her levels are being checked by her PCP.

## 2013-09-16 NOTE — Assessment & Plan Note (Signed)
No improvement despite different medication changes. Has been referred back to me, but so far no cardiac findings to explain her dyspnea. See above.

## 2013-09-16 NOTE — Assessment & Plan Note (Signed)
She is not having angina. She is on aspirin, statin, but not on ACE inhibitor or beta blocker as her blood pressure is relatively low. Also with concerns of pulmonary disease I did not want to use either of these 2 options.

## 2013-09-16 NOTE — Progress Notes (Signed)
PCP: Chesley Noon, MD  Clinic Note: Chief Complaint  Patient presents with  . Follow-up    results of myoview--no chest pain , no edma,  sob    HPI: Kim Herman is a 63 y.o. female with a Cardiovascular Problem List below who presents today for followup of recent Myoview stress test. As you recall she is a very pleasant woman who had been doing very well overall but during a trip to [place] in December 2011 developed unstable angina and was found to have 90% RCA lesion treated with overlapping fibers DES stents. She is doing very well after that but I started seeing her last year in May and she was noting exertional dyspnea. Recheck a CPET test which she did very well have a peak VO2 of 91% with obstructive pulmonary disease. I referred her to pulmonary medicine, and she is now being referred back to me saying that her COPD is not consistent with the extent of dyspnea she has. She has not had chest pain or pressure just simply exertional dyspnea, but occasionally at rest as well. To evaluate this she has undergone a nuclear stress test as noted below which was negative for ischemia or infarction. Normal EF.  Interval History: She presents today essentially the same from a symptomatic standpoint. She really just notes exertional dyspnea with now not so much exertion at all. She feels tired a lot and does note a lot of energy. No endurance. Simply walking up a flight of steps makes her feel extremely fatigued and dyspneic. Sometimes it makes her feel dizzy. No episodes of tightness in her chest however. She is not notably wheezing he has not had any improvement with most medications prescribed by the pulmonologist. She denies any heart failure symptoms of PND, orthopnea or edema. No rapid or irregular heartbeats/palpitations. No lightheadedness, dizziness and wooziness or syncope/near syncope, TIA/amaurosis fugax symptoms. No melena, hematochezia, hematuria, epistaxis. No claudication. No  significant weight gain or changes. No arthralgias or myalgias.  Past Medical History  Diagnosis Date  . CAD (coronary artery disease), native coronary artery 02/2010    s/p RCA stents 12/11, with another artery with 30 and 40% blockage  . Hyperparathyroidism   . Vitamin D deficiency   . Osteoporosis     DEXA 12/2010  . Hypothyroidism   . Dyslipidemia, goal LDL below 70     Due to CAD  . Gastroesophageal reflux   . Shingles 3/11  . Depression   . Anxiety   . Hyperlipidemia   . Bruises easily   . Hypertension   . PONV (postoperative nausea and vomiting)   . Hypercalcemia     Prior Cardiac Evaluation and Past Surgical History: Past Surgical History  Procedure Laterality Date  . Cardiac stents  03/2011    RCA  . Tubal ligation  1992  . Parathyroidectomy  05/22/2011    Procedure: PARATHYROIDECTOMY;  Surgeon: Earnstine Regal, MD;  Location: [facility];  Service: General;  Laterality: [name];  . Cardiac catheterization    . Coronary angioplasty  03/06/2011    Stenting of the proximal RCA with a 3.0 and a 3.5 Promus stent, followed by 3.5 post-dilation balloon.   . Doppler echocardiography  2012    2 D Echo with EF of 64%. Normal LV  size and function, normal diastolic function. No valvular lesions, just mild MR.   . Met/cpet  08/2011    showed mildly submaximal effort of 0.95 greater than 1.09 is maximal effort. Her peak  V02 was 91%.   . Nm myoview ltd  08/12/2013    EF 78%, normal LV function. No ischemia or infarction.    MEDICATIONS AND ALLERGIES REVIEWED IN EPIC No Change in Social and Family History  ROS: A comprehensive Review of Systems - Negative except Symptoms noted in history of present illness.  PHYSICAL EXAM BP 102/68  Pulse 82  Ht '5\' 6"'  (1.676 m)  Wt 164 lb (74.39 kg)  BMI 26.48 kg/m2 General appearance: alert, cooperative, appears stated age, no distress and Pleasant mood and affect  Neck: supple, no adenopathy, no carotid bruit, no JVD  HEENT: [place]/AT/MMM.  Anicteric sclera  Lungs: Mostly CTA B., normal percussion bilaterally and Nonlabored, and no wheezes rales or rhonchi  Heart: RRR, S1, S2 normal, no murmur, click, rub or gallop  Abdomen: soft, non-tender; bowel sounds normal; no masses, no organomegaly  Extremities: extremities normal, atraumatic, no cyanosis or edema  Neuro: Grossly intact   ASSESSMENT / PLAN: DOE (dyspnea on exertion) Now she's had a negative nuclear stress test, relatively normal CPET test but with evidence of pulmonary disease. I'm a loss to say that this is a cardiac problem.  She could have some microvascular dysfunction leading to diastolic dysfunction. We'll check a 2-D echocardiogram to see what her baseline function he has both a systolic and diastolic standpoint. She doesn't have murmurs to explain any dominant lesions.  Plan: 2-D echocardiogram, will try low dose Ranexa for  possible microvascular involvement. We have given her samples to try for a month to see if is any improvement. If not, she will try low dose nitrate (Imdur 30 mg)   CAD S/P percutaneous coronary angioplasty - PCI of RCA She is not having angina. She is on aspirin, statin, but not on ACE inhibitor or beta blocker as her blood pressure is relatively low. Also with concerns of pulmonary disease I did not want to use either of these 2 options.  Chronic obstructive asthma, unspecified No improvement despite different medication changes. Has been referred back to me, but so far no cardiac findings to explain her dyspnea. See above.  Dyslipidemia, goal LDL below 70 Continue statin. Last check there was borderline control. Continue with omega-3 fish oil and co-Q10. Her levels are being checked by her PCP.    Orders Placed This Encounter  Procedures  . 2D Echocardiogram without contrast    Standing Status: Future     Number of Occurrences:      Standing Expiration Date: 09/14/2014    Order Specific Question:  Type of Echo    Answer:  Complete     Order Specific Question:  Where should this test be performed    Answer:  [facility]    Order Specific Question:  Reason for exam-Echo    Answer:  Dyspnea  786.09   Meds ordered this encounter  Medications  . isosorbide mononitrate (IMDUR) 30 MG 24 hr tablet    Sig: Take 1 tablet (30 mg total) by mouth daily.    Dispense:  30 tablet    Refill:  6  . ranolazine (RANEXA) 500 MG 12 hr tablet    Sig: Take 1 tablet (500 mg total) by mouth 2 (two) times daily.    Dispense:  56 tablet    Refill:  0    Followup: 2 months  DAVID W. Ellyn Hack, M.D., M.S. Interventional Cardiologist [facility]

## 2013-09-16 NOTE — Assessment & Plan Note (Addendum)
Now she's had a negative nuclear stress test, relatively normal CPET test but with evidence of pulmonary disease. I'm a loss to say that this is a cardiac problem.  She could have some microvascular dysfunction leading to diastolic dysfunction. We'll check a 2-D echocardiogram to see what her baseline function he has both a systolic and diastolic standpoint. She doesn't have murmurs to explain any dominant lesions.  Plan: 2-D echocardiogram, will try low dose Ranexa for  possible microvascular involvement. We have given her samples to try for a month to see if is any improvement. If not, she will try low dose nitrate (Imdur 30 mg)  Actually denies see if she we can get her to have

## 2013-09-22 ENCOUNTER — Ambulatory Visit (HOSPITAL_COMMUNITY)
Admission: RE | Admit: 2013-09-22 | Discharge: 2013-09-22 | Disposition: A | Discharge status: Home / Self Care | Payer: BC Managed Care – PPO | Source: Ambulatory Visit | Attending: Cardiology | Admitting: Cardiology

## 2013-09-22 DIAGNOSIS — R0609 Other forms of dyspnea: Secondary | ICD-10-CM | POA: Insufficient documentation

## 2013-09-22 DIAGNOSIS — R0989 Other specified symptoms and signs involving the circulatory and respiratory systems: Principal | ICD-10-CM | POA: Insufficient documentation

## 2013-09-22 DIAGNOSIS — I059 Rheumatic mitral valve disease, unspecified: Secondary | ICD-10-CM

## 2013-09-22 NOTE — Progress Notes (Signed)
2D Echocardiogram Complete.  09/22/2013   Bethany McMahill, RDCS

## 2013-09-25 NOTE — Progress Notes (Signed)
Quick Note:  Echo results: 09/22/2013 Good news: Essentially normal echocardiogram and normal pump function and normal valve function. No signs to suggest heart attack.. EF: 65-70%. Grade 1 Diastolic dysfunction -- would not really explain notable exertional dyspnea. No regional wall motion abnormalities  HARDING,DAVID W, MD  ______

## 2013-09-26 ENCOUNTER — Telehealth: Payer: Self-pay | Admitting: *Deleted

## 2013-09-26 NOTE — Telephone Encounter (Signed)
Spoke to patient. Result given . Verbalized understanding  

## 2013-09-26 NOTE — Telephone Encounter (Signed)
Message copied by Raiford Simmonds on Mon Sep 26, 2013 12:37 PM ------      Message from: Leonie Man      Created: Sun Sep 25, 2013  5:25 PM       Echo results: 09/22/2013      Good news: Essentially normal echocardiogram and normal pump function and normal valve function.  No signs to suggest heart attack..      EF: 65-70%. Grade 1 Diastolic dysfunction -- would not really explain notable exertional dyspnea.      No regional wall motion abnormalities            HARDING,DAVID W, MD       ------

## 2013-09-29 NOTE — Telephone Encounter (Signed)
Encounter complete. 

## 2013-10-26 ENCOUNTER — Encounter: Payer: Self-pay | Admitting: Cardiology

## 2013-11-02 ENCOUNTER — Telehealth: Payer: Self-pay | Admitting: Cardiology

## 2013-11-02 NOTE — Telephone Encounter (Signed)
Returned call to patient new mychart activation code # [id number].Code expires 01/11/14.

## 2013-11-02 NOTE — Telephone Encounter (Signed)
Pt lock out of my-chart,needs some help.

## 2013-11-22 ENCOUNTER — Ambulatory Visit: Payer: BC Managed Care – PPO | Admitting: Cardiology

## 2013-12-21 ENCOUNTER — Ambulatory Visit: Payer: BC Managed Care – PPO | Admitting: Pulmonary Disease

## 2013-12-27 ENCOUNTER — Ambulatory Visit (INDEPENDENT_AMBULATORY_CARE_PROVIDER_SITE_OTHER): Payer: BC Managed Care – PPO | Admitting: Pulmonary Disease

## 2013-12-27 ENCOUNTER — Encounter: Payer: Self-pay | Admitting: Pulmonary Disease

## 2013-12-27 VITALS — BP 118/74 | HR 76 | Temp 98.0°F | Ht 66.0 in | Wt 164.2 lb

## 2013-12-27 DIAGNOSIS — J4489 Other specified chronic obstructive pulmonary disease: Secondary | ICD-10-CM

## 2013-12-27 DIAGNOSIS — J449 Chronic obstructive pulmonary disease, unspecified: Secondary | ICD-10-CM

## 2013-12-27 NOTE — Progress Notes (Signed)
   Subjective:    Patient ID: Kim Herman, female    DOB: [birth year], 63 y.o.   MRN: [id number]  HPI The patient comes in today for followup of her chronic obstructive asthma. She has always had dyspnea on exertion out of proportion to her degree of airflow.  It was unclear whether this was related to a cardiac issue, or whether it and conditioning were playing significant roles.  She has been seen by cardiology who does not feel she has a significant cardiac problem. She did not see any change with the addition of Spiriva from the last visit, and continues to have an upper airway cough with constant throat clearing.   Review of Systems  Constitutional: Negative for fever and unexpected weight change.  HENT: Negative for congestion, dental problem, ear pain, nosebleeds, postnasal drip, rhinorrhea, sinus pressure, sneezing, sore throat and trouble swallowing.   Eyes: Negative for redness and itching.  Respiratory: Positive for cough and shortness of breath. Negative for chest tightness and wheezing.   Cardiovascular: Negative for palpitations and leg swelling.  Gastrointestinal: Negative for nausea and vomiting.  Genitourinary: Negative for dysuria.  Musculoskeletal: Negative for joint swelling.  Skin: Negative for rash.  Neurological: Negative for headaches.  Hematological: Does not bruise/bleed easily.  Psychiatric/Behavioral: Negative for dysphoric mood. The patient is not nervous/anxious.        Objective:   Physical Exam Well-developed female in no acute distress Nose without purulence or discharge noted Neck without lymphadenopathy or thyromegaly Chest totally clear to auscultation, no wheezing Cardiac exam with regular rate and rhythm Lower extremities without edema, no cyanosis Alert and oriented, moves all 4 extremities.       Assessment & Plan:

## 2013-12-27 NOTE — Assessment & Plan Note (Signed)
The patient continues to have dyspnea on exertion which is out of proportion to her objective pulmonary findings. She has seen cardiology again who does not feel there is a significant cardiac issue contributing to her symptoms. There is no question that she has chronic obstructive asthma, and will need to be on a maintenance therapy in order to prevent further airway remodeling and progressive obstructive lung disease. However, I have told her that aggressive weight loss and conditioning will help her more than anything else from a functional capacity standpoint.

## 2013-12-27 NOTE — Patient Instructions (Addendum)
Will start on breo 100.  Take one inhalation each am everyday no matter what.  Keep mouth rinsed.  Remember, this is to prevent worsening of your airflow reduction. Take the next 2-3 mos and push yourself as much as possible from an exercise standpoint.  Also work on weight loss.  Continue on your nexium, and take zyrtec at bedtime for postnasal drip.  Do not clear your throat. Let me see you back in 14mos to check on things.

## 2014-01-24 ENCOUNTER — Telehealth: Payer: Self-pay | Admitting: Pulmonary Disease

## 2014-01-24 MED ORDER — FLUTICASONE FUROATE-VILANTEROL 100-25 MCG/INH IN AEPB: 1.0000 | INHALATION_SPRAY | Freq: Every day | RESPIRATORY_TRACT | Status: DC

## 2014-01-24 NOTE — Telephone Encounter (Signed)
Pt was seen back in the end of September by [facility] and was given a sample of the breo. Pt calling to get rx sent to her mail order pharmacy.  This has been done and pt is aware. Nothing further is needed.

## 2014-02-17 ENCOUNTER — Encounter: Payer: Self-pay | Admitting: Pulmonary Disease

## 2014-02-17 NOTE — Telephone Encounter (Signed)
i called and spoke with pt and she stated that she will hold off on the breo for now until the sinus infection clears up.  She does have an albuterol rescue inhaler that she will use if needed.  She is aware to call back if needed for anything further.

## 2014-02-17 NOTE — Telephone Encounter (Signed)
Please advise pt to stop using Breo until her sinus infection has improved.  Please ensure she has an albuterol inhaler to use in the meantime.  She should call back if her breathing gets worse off of Breo.

## 2014-02-17 NOTE — Telephone Encounter (Signed)
   I'm am experiencing my 2nd sinus infection in the last month. I haven't had anything like this in several years so I got to thinking that the only thing different is my Breo inhaler. Upon reviewing the possible side effects, I see upper respiratory infections are one. Should I discontinue using the Breo until the infection is gone. The last one lasted 10 days & I was lethargic also. Thank you! Kim Herman    Please advise VS>  thanks

## 2014-03-20 ENCOUNTER — Ambulatory Visit (INDEPENDENT_AMBULATORY_CARE_PROVIDER_SITE_OTHER): Payer: BC Managed Care – PPO | Admitting: Pulmonary Disease

## 2014-03-20 ENCOUNTER — Encounter: Payer: Self-pay | Admitting: Pulmonary Disease

## 2014-03-20 VITALS — BP 122/66 | HR 82 | Temp 97.6°F | Ht 66.0 in | Wt 167.4 lb

## 2014-03-20 DIAGNOSIS — R059 Cough, unspecified: Secondary | ICD-10-CM | POA: Insufficient documentation

## 2014-03-20 DIAGNOSIS — J449 Chronic obstructive pulmonary disease, unspecified: Secondary | ICD-10-CM

## 2014-03-20 DIAGNOSIS — R05 Cough: Secondary | ICD-10-CM | POA: Insufficient documentation

## 2014-03-20 NOTE — Assessment & Plan Note (Signed)
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 I think it has anything to do with her lower airway disease. I suspect that postnasal drip and reflux are contributing factors, and she continues to have issues, she may need imaging of her sinuses and an upper airway evaluation by otolaryngology. I have reviewed with her again the behavioral therapies for the upper airway cough syndrome.

## 2014-03-20 NOTE — Progress Notes (Signed)
   Subjective:    Patient ID: Kim Herman, female    DOB: May 04, 1950, 63 y.o.   MRN: [id number]  HPI The patient comes in today for follow-up of her chronic obstructive asthma. She is staying on Breo, and feels that her exertional tolerance is at baseline. She still has chronic dyspnea on exertion, and I explained to her that she has fixed airflow obstruction on her PFTs. She also needs to work on weight reduction and conditioning. She also has a history of upper airway cough, and continues to have a globus sensation with frequent throat clearing. Her friends think she does it much more than she realizes. She is continuing on her proton pump inhibitor and antihistamine, and I've asked her to work harder on behavioral therapies. She may need further workup if this continues to be an issue.   Review of Systems  Constitutional: Negative for fever and unexpected weight change.  HENT: Positive for congestion and postnasal drip. Negative for dental problem, ear pain, nosebleeds, rhinorrhea, sinus pressure, sneezing, sore throat and trouble swallowing.   Eyes: Negative for redness and itching.  Respiratory: Positive for cough and shortness of breath. Negative for chest tightness and wheezing.   Cardiovascular: Negative for palpitations and leg swelling.  Gastrointestinal: Negative for nausea and vomiting.  Genitourinary: Negative for dysuria.  Musculoskeletal: Negative for joint swelling.  Skin: Negative for rash.  Neurological: Negative for headaches.  Hematological: Does not bruise/bleed easily.  Psychiatric/Behavioral: Negative for dysphoric mood. The patient is not nervous/anxious.        Objective:   Physical Exam Overweight female in no acute distress Nose without purulence or discharge noted Neck without lymphadenopathy or thyromegaly Chest totally clear to auscultation, no wheezing Cardiac exam with regular rate and rhythm Lower extremities without significant edema, no  cyanosis Alert and oriented, moves all 4 extremities.       Assessment & Plan:

## 2014-03-20 NOTE — Patient Instructions (Signed)
Stay on breo for now, but if you think it is causing side effects, can change. Stay on acid reflux meds, and zyrtec for postnasal drip Continue to use hard candy, do not overuse your voice, no throat clearing.  If your cough/throat irritation continues, will have ENT check your voicebox and may image your sinuses. followup with me again in 30mos, but call if having more issues.

## 2014-03-20 NOTE — Assessment & Plan Note (Signed)
The patient overall is doing fairly well from an exertional tolerance standpoint, and feels that she is at a reasonable baseline. She initially thought that her inhaler may be causing sinus issues, but these have since resolved. I have asked her to continue on her current meds, she feels they are causing specific side effects. I also stressed to her the importance of weight reduction and conditioning.

## 2014-07-21 ENCOUNTER — Ambulatory Visit: Payer: BC Managed Care – PPO | Admitting: Pulmonary Disease

## 2014-08-09 ENCOUNTER — Ambulatory Visit: Payer: Self-pay | Admitting: Pulmonary Disease

## 2015-09-04 ENCOUNTER — Ambulatory Visit: Payer: Self-pay | Admitting: Cardiology

## 2015-09-10 ENCOUNTER — Encounter: Payer: Self-pay | Admitting: Cardiology

## 2015-09-10 NOTE — Progress Notes (Signed)
PCP: Chesley Noon, MD  Clinic Note: Chief Complaint  Patient presents with  . Follow-up  . Shortness of Breath  . Coronary Artery Disease    HPI: Kim Herman is a 65 y.o. female with a PMH below who presents today for 2 yr f/u.  Marland Kitchen  She is a very pleasant woman who developed unstable angina in December 2011, while on a trip to [place]. She was found to have a 90% RCA lesion treated with 2 overlapping Promus DES stents. In May 2015, she noted exertional dyspnea. CPET test demonstrated VO2 was 91% with obstructive pulmonary disease. She was referred to see a pulmonologist, who suggested that the level of dyspnea was not consistent with her COPD. She had a Myoview done that was negative for ischemia or infarction.  Kim Herman was last seen in June 2015. Check 2-D echocardiogram and tried low-dose Ranexa. Thought was potentially microvascular ischemia. She has been followed by Dr. Braulio Conte in the pulmonary medicine clinic, and seems to have been doing relatively well. Last clinic visit there was December 2015.  Recent Hospitalizations: None  Studies Reviewed:   2-D Echocardiogram 09/22/2013: EF 65-70%. GR 1 DD  Interval History: She presents today really without any major complaints. It has been 2 years since her last visit. She does note a little bit of exertional dyspnea, but no resting dyspnea. No chest tightness or pressure with rest or exertion. None of her angina type symptoms. She still has her baseline dyspnea that is worse with exertion. She's tried several inhalers but they are not very effective.  No PND, orthopnea or edema. No palpitations, lightheadedness, dizziness, weakness or syncope/near syncope. No TIA/amaurosis fugax symptoms. No melena, hematochezia, hematuria, or epstaxis. No claudication.  ROS: A comprehensive was performed. Review of Systems  Constitutional: Negative for fever and malaise/fatigue.  HENT: Negative for nosebleeds.   Respiratory:  Negative for shortness of breath and wheezing.   Gastrointestinal: Negative for heartburn and abdominal pain.  Musculoskeletal: Positive for myalgias. Negative for joint pain.       Leg cramping and aching, but she thinks is related to Crestor.  Neurological: Positive for headaches.  Endo/Heme/Allergies: Does not bruise/bleed easily.  Psychiatric/Behavioral: Negative for depression and memory loss. The patient is not nervous/anxious and does not have insomnia.   All other systems reviewed and are negative.   Past Medical History  Diagnosis Date  . CAD S/P percutaneous coronary angioplasty 02/2010    s/p RCA stents 12/11, with another artery with 30 and 40% blockage  . Hyperparathyroidism   . Vitamin D deficiency   . Osteoporosis     DEXA 12/2010  . Hypothyroidism   . Dyslipidemia, goal LDL below 70     Due to CAD  . Gastroesophageal reflux   . Shingles 3/11  . Depression   . Anxiety   . Hyperlipidemia   . Bruises easily   . Hypertension   . PONV (postoperative nausea and vomiting)   . Hypercalcemia     Past Surgical History  Procedure Laterality Date  . Cardiac stents  02/2010    RCA  . Tubal ligation  1992  . Parathyroidectomy  05/22/2011    Procedure: PARATHYROIDECTOMY;  Surgeon: Earnstine Regal, MD;  Location: [facility];  Service: General;  Laterality: [name];  . Cardiac catheterization    . Coronary angioplasty  03/05/2010    Stenting of the proximal RCA with a 3.0 and a 3.5 Promus stent, followed by 3.5 post-dilation balloon.   Marland Kitchen  Doppler echocardiography  2012; June 2015    a. 2 D Echo with EF of 64%. Normal LV  size and function, normal diastolic function. No valvular lesions, just mild MR;; b. EF 65-70%. GR 1 DD  . Met/cpet  08/2011    showed mildly submaximal effort of 0.95 greater than 1.09 is maximal effort. Her peak V02 was 91%.   . Nm myoview ltd  08/12/2013    EF 78%, normal LV function. No ischemia or infarction.   Prior to Admission medications   Medication Sig  Start Date End Date Taking? Authorizing Provider  acyclovir (ZOVIRAX) 400 MG tablet Take 400 mg by mouth 2 (two) times daily as needed. Outbreak    Yes Historical Provider, MD  aspirin 81 MG tablet Take 81 mg by mouth daily.   Yes Historical Provider, MD  cetirizine (ZYRTEC) 10 MG tablet Take 10 mg by mouth daily.   Yes Historical Provider, MD  Cholecalciferol (VITAMIN D3) 2000 UNITS TABS Take 2,000 Units by mouth daily.    Yes Historical Provider, MD  co-enzyme Q-10 30 MG capsule Take 30 mg by mouth daily.   Yes Historical Provider, MD  escitalopram (LEXAPRO) 10 MG tablet Once daily 03/09/14  Yes Historical Provider, MD  Esomeprazole Magnesium (NEXIUM PO) Take 40 mg by mouth daily.    Yes Historical Provider, MD  fish oil-omega-3 fatty acids 1000 MG capsule Take 1,200 mg by mouth daily.    Yes Historical Provider, MD  Fluticasone Furoate-Vilanterol (BREO ELLIPTA) 100-25 MCG/INH AEPB Inhale 1 puff into the lungs daily. 01/24/14  Yes Kathee Delton, MD  levothyroxine (SYNTHROID, LEVOTHROID) 75 MCG tablet Take 75 mcg by mouth at bedtime.    Yes Historical Provider, MD  nitroGLYCERIN (NITROSTAT) 0.4 MG SL tablet Place 1 tablet (0.4 mg total) under the tongue every 5 (five) minutes as needed. Chest pain 08/04/13  Yes Leonie Man, MD  rosuvastatin (CRESTOR) 10 MG tablet Take by mouth daily.   Yes Historical Provider, MD    Allergies  Allergen Reactions  . Codeine Nausea Only  . Sulfa Antibiotics Itching    Hands only, per patient. Happened in childhood.    Social History   Social History  . Marital Status: Married    Spouse Name: [name]  . Number of Children: [name]  . Years of Education: [name]   Occupational History  . retired    Social History Main Topics  . Smoking status: Never Smoker   . Smokeless tobacco: Never Used  . Alcohol Use: 2.4 oz/week    4 Glasses of wine per week  . Drug Use: No  . Sexual Activity: Not Asked   Other Topics Concern  . None   Social History Narrative     She is widowed, now back in a relationship, drinks social alcohol, she used to exercise 5 days a week for about 30 minutes at a time walking, but not anymore.   does not smoke.   Family History  Problem Relation Age of Onset  . Hypertension Father   . Aneurysm Father     abdominal  . Hypertension Mother   . Dementia Mother   . Cancer Mother     bladder  . Diabetes type II Mother   . Hyperlipidemia Brother   . Hyperparathyroidism Brother   . Depression Sister   . Bladder Cancer Brother   . Diabetes Brother     Wt Readings from Last 3 Encounters:  09/11/15 165 lb (74.844 kg)  03/20/14 167  lb 6.4 oz (75.932 kg)  12/27/13 164 lb 3.2 oz (74.481 kg)    PHYSICAL EXAM BP 102/74 mmHg  Pulse 68  Ht '5\' 6"'  (1.676 m)  Wt 165 lb (74.844 kg)  BMI 26.64 kg/m2 General appearance: alert, cooperative, appears stated age, no distress and Pleasant mood and affect  Neck: supple, no adenopathy, no carotid bruit, no JVD  HEENT: [place]/AT/MMM. Anicteric sclera  Lungs: Mostly CTA B., normal percussion bilaterally and Nonlabored, and no wheezes rales or rhonchi  Heart: RRR, S1, S2 normal, no murmur, click, rub or gallop  Abdomen: soft, non-tender; bowel sounds normal; no masses, no organomegaly  Extremities: extremities normal, atraumatic, no cyanosis or edema  Neuro: Grossly intact     Adult ECG Report  Rate: 68 ;  Rhythm: normal sinus rhythm and Rightward Axis (98);   Narrative Interpretation: Stable EKG   Other studies Reviewed: Additional studies/ records that were reviewed today include:  Recent Labs:  Not currently available   ASSESSMENT / PLAN: Problem List Items Addressed This Visit    Dyslipidemia, goal LDL below 70 (Chronic)    Labs are followed by PCP. She is on stable regimen. However I think with her aching symptoms are and arthralgia symptoms we can see if backing off on Crestor will help. In doing so we should also increase if her omega-3 fatty acids to twice a day.       Relevant Orders   EKG 12-Lead (Completed)   DOE (dyspnea on exertion)    This is still multifactorial. I think there probably is some microvascular diastolic dysfunction component, but also probably has some pulmonary component as well. Also been deconditioning plays a role.  It seems to be stable at a new baseline for her.      CAD S/P percutaneous coronary angioplasty - PCI of RCA - Primary (Chronic)    No active anginal symptoms. Remains on aspirin without Plavix. Is on a combination of omega-3 fatty acids and Crestor. Blood pressures are stable, also not on beta blocker or ACE inhibitor/ARB. In fact her blood pressures are little low today.      Relevant Orders   EKG 12-Lead (Completed)      Current medicines are reviewed at length with the patient today. (+/- concerns) n/a The following changes have been made:  Sneads  NO OTHER CHANGES WITH CURRENT MEDICATIONS   Your physician wants you to follow-up in:12 MONTHS WITH DR Ellyn Hack   Studies Ordered:   Orders Placed This Encounter  Procedures  . EKG 12-Lead      Glenetta Hew, M.D., M.S. Interventional Cardiologist   Pager # [phone] Phone # [phone] [address] [place], [place] [address]

## 2015-09-11 ENCOUNTER — Ambulatory Visit (INDEPENDENT_AMBULATORY_CARE_PROVIDER_SITE_OTHER): Payer: BLUE CROSS/BLUE SHIELD | Admitting: Cardiology

## 2015-09-11 ENCOUNTER — Encounter: Payer: Self-pay | Admitting: Cardiology

## 2015-09-11 VITALS — BP 102/74 | HR 68 | Ht 66.0 in | Wt 165.0 lb

## 2015-09-11 DIAGNOSIS — Z9861 Coronary angioplasty status: Secondary | ICD-10-CM

## 2015-09-11 DIAGNOSIS — E785 Hyperlipidemia, unspecified: Secondary | ICD-10-CM | POA: Diagnosis not present

## 2015-09-11 DIAGNOSIS — I2510 Atherosclerotic heart disease of native coronary artery without angina pectoris: Secondary | ICD-10-CM

## 2015-09-11 DIAGNOSIS — R0609 Other forms of dyspnea: Secondary | ICD-10-CM | POA: Diagnosis not present

## 2015-09-11 NOTE — Patient Instructions (Addendum)
YOU MAY TRY TAKING CRESTOR EVERY OTHER DAY  NO OTHER CHANGES WITH CURRENT MEDICATIONS   Your physician wants you to follow-up in:12 MONTHS WITH DR Ellyn Hack You will receive a reminder letter in the mail two months in advance. If you don't receive a letter, please call our office to schedule the follow-up appointment.   If you need a refill on your cardiac medications before your next appointment, please call your pharmacy.

## 2015-09-13 ENCOUNTER — Encounter: Payer: Self-pay | Admitting: Cardiology

## 2015-09-13 NOTE — Assessment & Plan Note (Signed)
Labs are followed by PCP. She is on stable regimen. However I think with her aching symptoms are and arthralgia symptoms we can see if backing off on Crestor will help. In doing so we should also increase if her omega-3 fatty acids to twice a day.

## 2015-09-13 NOTE — Assessment & Plan Note (Addendum)
No active anginal symptoms. Remains on aspirin without Plavix. Is on a combination of omega-3 fatty acids and Crestor. Blood pressures are stable, also not on beta blocker or ACE inhibitor/ARB. In fact her blood pressures are little low today.

## 2015-09-13 NOTE — Assessment & Plan Note (Signed)
This is still multifactorial. I think there probably is some microvascular diastolic dysfunction component, but also probably has some pulmonary component as well. Also been deconditioning plays a role.  It seems to be stable at a new baseline for her.

## 2016-09-11 ENCOUNTER — Encounter: Payer: Self-pay | Admitting: Cardiology

## 2016-09-11 ENCOUNTER — Ambulatory Visit (INDEPENDENT_AMBULATORY_CARE_PROVIDER_SITE_OTHER): Payer: Medicare Other | Admitting: Cardiology

## 2016-09-11 VITALS — BP 120/82 | HR 78 | Ht 66.0 in | Wt 166.0 lb

## 2016-09-11 DIAGNOSIS — E785 Hyperlipidemia, unspecified: Secondary | ICD-10-CM

## 2016-09-11 DIAGNOSIS — R5382 Chronic fatigue, unspecified: Secondary | ICD-10-CM | POA: Diagnosis not present

## 2016-09-11 DIAGNOSIS — I2510 Atherosclerotic heart disease of native coronary artery without angina pectoris: Secondary | ICD-10-CM | POA: Diagnosis not present

## 2016-09-11 DIAGNOSIS — R0609 Other forms of dyspnea: Secondary | ICD-10-CM | POA: Diagnosis not present

## 2016-09-11 DIAGNOSIS — Z9861 Coronary angioplasty status: Secondary | ICD-10-CM

## 2016-09-11 NOTE — Progress Notes (Addendum)
PCP: Chesley Noon, MD  Clinic Note: Chief Complaint  Patient presents with  . Follow-up    No chest pain, shortness of breath-frequent, no edenma, has cramping in legs, no lightheaded or dizziness  . Coronary Artery Disease  . Shortness of Breath    HPI: Kim Herman is a 66 y.o. female with a PMH below who presents today for annual follow-up of CAD PCI. CAD history:  Unstable angina December 2011 while on vacation in [place].:  Cardiac cath showed 90% RCA -> 2 overlapping Promus DES stents  May 2015 CP X4 exertional dyspnea - Peak VO2 91% obstructive pulmonary disease -> Referred to pulmonary medicine who did not think that her level of dyspnea was consistent with her COPD. -> Myoview negative for ischemia  Echo June 2015 showed normal function EF 01% with diastolic dysfunction grade 1. .  Kim Herman was last seen on 09/11/2015 - doing well  Recent Hospitalizations: None  Studies Personally Reviewed - (if available, images/films reviewed: From Epic Chart or Care Everywhere)  None  Interval History: She presents today overall relatively stable. Only notes that she is constantly feeling shortness of breath. Otherwise stable. Mild leg cramping at night.  No chest pain or pressure consistent with her previous angina at rest or exertion. No PND, orthopnea or edema. No palpitations, lightheadedness, dizziness, weakness or syncope/near syncope. No TIA/amaurosis fugax symptoms. No melena, hematochezia, hematuria, or epstaxis. No claudication.  ROS: A comprehensive was performed. ROS   I have reviewed and (if needed) personally updated the patient's problem list, medications, allergies, past medical and surgical history, social and family history.   Past Medical History:  Diagnosis Date  . Anxiety   . Bruises easily   . CAD S/P percutaneous coronary angioplasty 02/2010   s/p RCA stents 12/11, with another artery with 30 and 40% blockage  . Depression   .  Dyslipidemia, goal LDL below 70    Due to CAD  . Gastroesophageal reflux   . Hypercalcemia   . Hyperlipidemia   . Hyperparathyroidism   . Hypertension   . Hypothyroidism   . Osteoporosis    DEXA 12/2010  . PONV (postoperative nausea and vomiting)   . Shingles 3/11  . Vitamin D deficiency     Past Surgical History:  Procedure Laterality Date  . CARDIAC CATHETERIZATION    . cardiac stents  02/2010   RCA  . CORONARY ANGIOPLASTY  03/05/2010   Stenting of the proximal RCA with a 3.0 and a 3.5 Promus stent, followed by 3.5 post-dilation balloon.   Jodelle Gross ECHOCARDIOGRAPHY  2012; June 2015   a. 2 D Echo with EF of 64%. Normal LV  size and function, normal diastolic function. No valvular lesions, just mild MR;; b. EF 65-70%. GR 1 DD  . MET/CPET  08/2011   showed mildly submaximal effort of 0.95 greater than 1.09 is maximal effort. Her peak V02 was 91%.   Marland Kitchen NM MYOVIEW LTD  08/12/2013   EF 78%, normal LV function. No ischemia or infarction.  Marland Kitchen PARATHYROIDECTOMY  05/22/2011   Procedure: PARATHYROIDECTOMY;  Surgeon: Earnstine Regal, MD;  Location: [facility];  Service: General;  Laterality: [name];  . TUBAL LIGATION  1992    Current Meds  Medication Sig  . acyclovir (ZOVIRAX) 400 MG tablet Take 400 mg by mouth 2 (two) times daily as needed. Outbreak   . aspirin 81 MG tablet Take 81 mg by mouth daily.  . cetirizine (ZYRTEC) 10 MG tablet  Take 10 mg by mouth daily.  . Cholecalciferol (VITAMIN D3) 2000 UNITS TABS Take 2,000 Units by mouth daily.   Marland Kitchen co-enzyme Q-10 30 MG capsule Take 30 mg by mouth daily.  Marland Kitchen escitalopram (LEXAPRO) 10 MG tablet Once daily  . Esomeprazole Magnesium (NEXIUM PO) Take 40 mg by mouth daily.   . fish oil-omega-3 fatty acids 1000 MG capsule Take 1,200 mg by mouth daily.   . Fluticasone Furoate-Vilanterol (BREO ELLIPTA) 100-25 MCG/INH AEPB Inhale 1 puff into the lungs daily.  Marland Kitchen levothyroxine (SYNTHROID, LEVOTHROID) 75 MCG tablet Take 75 mcg by mouth at bedtime.   .  nitroGLYCERIN (NITROSTAT) 0.4 MG SL tablet Place 1 tablet (0.4 mg total) under the tongue every 5 (five) minutes as needed. Chest pain  . rosuvastatin (CRESTOR) 10 MG tablet Take by mouth daily.    Allergies  Allergen Reactions  . Codeine Nausea Only  . Sulfa Antibiotics Itching    Hands only, per patient. Happened in childhood.    Social History   Social History  . Marital status: Married    Spouse name: [name]  . Number of children: [name]  . Years of education: [name]   Occupational History  . retired    Social History Main Topics  . Smoking status: Never Smoker  . Smokeless tobacco: Never Used  . Alcohol use 2.4 oz/week    4 Glasses of wine per week  . Drug use: No  . Sexual activity: Not Asked   Other Topics Concern  . None   Social History Narrative   She is widowed, now back in a relationship, drinks social alcohol, she used to exercise 5 days a week for about 30 minutes at a time walking, but not anymore.   does not smoke.    family history includes Aneurysm in her father; Bladder Cancer in her brother; Cancer in her mother; Dementia in her mother; Depression in her sister; Diabetes in her brother; Diabetes type II in her mother; Hyperlipidemia in her brother; Hyperparathyroidism in her brother; Hypertension in her father and mother.  Wt Readings from Last 3 Encounters:  09/11/16 166 lb (75.3 kg)  09/11/15 165 lb (74.8 kg)  03/20/14 167 lb 6.4 oz (75.9 kg)    PHYSICAL EXAM BP 120/82   Pulse 78   Ht _0  (1.676 m)   Wt 166 lb (75.3 kg)   BMI 26.79 kg/m  General appearance: alert, cooperative, appears stated age, no distress. Well-nourished, well-groomed HEENT: [place]/AT, EOMI, MMM, anicteric sclera Neck: no adenopathy, no carotid bruit and no JVD Lungs: clear to auscultation bilaterally, normal percussion bilaterally and non-labored Heart: regular rate and rhythm, S1 &S2 normal, no murmur, click, rub or gallop; none dispensed PMI Abdomen: soft, non-tender; bowel  sounds normal; no masses,  no organomegaly; no HJR Extremities: extremities normal, atraumatic, no cyanosis, or edema  Pulses: 2+ and symmetric;  Skin: normal, mobility and turgor normal, no evidence of bleeding or bruising and no lesions noted Neurologic: Mental status: Alert & oriented x 3, thought content appropriate; non-focal exam.  Pleasant mood & affect.    Adult ECG Report  Rate: 78 ;  Rhythm: normal sinus rhythm and Rightward axis of 91. Otherwise normal intervals and durations. Normal voltage;   Narrative Interpretation: relatively stable EKG   Other studies Reviewed: Additional studies/ records that were reviewed today include:  Recent Labs:  No results found for: CHOL, HDL, LDLCALC, LDLDIRECT, TRIG, CHOLHDL Labs from PCP 03/19/2016 (data arrived after her visit): Total cholesterol 207, triglycerides  57, HDL 82, LDL 114  Sodium 139, potassium 4.4, chloride 98, bicarbonate 25, BUN 9, creatinine 0.76, glucose 91, calcium 9.8. His ALT/alkaline phosphatase 25/50/79. Left is normal.  ASSESSMENT / PLAN: Problem List Items Addressed This Visit    CAD S/P percutaneous coronary angioplasty - PCI of RCA - Primary (Chronic)    No active angina symptoms. Only has dyspnea which we have evaluated in great detail. She is only on aspirin and statin. Not on beta blocker or or ARB because of borderline hypotension. With no active symptoms, I would not try to add back his medications.      Relevant Orders   EKG 12-Lead (Completed)   DOE (dyspnea on exertion)    Still not sure what this is related to. I think it is multifactorial. Probably not cardiac in nature however because we had a relatively normal echo and Myoview to evaluate this along with CPX.  Continue to recommend exercise. She needs to talk to her PCP about whether or not she should continue with her current inhaler combination or switch back to different ones because she didn't notice much help from the [facility].      Relevant  Orders   EKG 12-Lead (Completed)   Dyslipidemia, goal LDL below 70 (Chronic)    Not at goal based on December evaluation. We try to back off on her Crestor to see if that helped some of her symptoms, but I'm not sure if it did. Not able to achieve target lipids by next check, I would consider referral to our lipid clinic for PCSK9 inhibitor.  - Having looking at her labs from December, I will recheck some now (will have to call her) For now continue with fish oil, co-every 10 and current dose of Crestor. Adjust based on Labs that we will call her to have rechecked.      Relevant Orders   EKG 12-Lead (Completed)   Fatigue    Hard to blame on any CV etiology -- no benefit from reduced Statin dose. ? Can we increase Crestor back to 20 or even 40 mg.  Needs to stay active & exercise.         Current medicines are reviewed at length with the patient today. (+/- concerns) n/a The following changes have been made: n/a  Patient Instructions  No change with current medications        Your physician wants you to follow-up in 12 months with DR HARDING. You will receive a reminder letter in the mail two months in advance. If you don't receive a letter, please call our office to schedule the follow-up appointment.    Studies Ordered:   Orders Placed This Encounter  Procedures  . EKG 12-Lead      Glenetta Hew, M.D., M.S. Interventional Cardiologist   Pager # [phone] Phone # [phone] [address] [place], [place] [address]

## 2016-09-11 NOTE — Patient Instructions (Signed)
No change with current medications        Your physician wants you to follow-up in 12 months with DR HARDING. You will receive a reminder letter in the mail two months in advance. If you don't receive a letter, please call our office to schedule the follow-up appointment.

## 2016-09-14 ENCOUNTER — Encounter: Payer: Self-pay | Admitting: Cardiology

## 2016-09-14 NOTE — Assessment & Plan Note (Signed)
Still not sure what this is related to. I think it is multifactorial. Probably not cardiac in nature however because we had a relatively normal echo and Myoview to evaluate this along with CPX.  Continue to recommend exercise. She needs to talk to her PCP about whether or not she should continue with her current inhaler combination or switch back to different ones because she didn't notice much help from the [facility].

## 2016-09-14 NOTE — Assessment & Plan Note (Signed)
Hard to blame on any CV etiology -- no benefit from reduced Statin dose. ? Can we increase Crestor back to 20 or even 40 mg.  Needs to stay active & exercise.

## 2016-09-14 NOTE — Assessment & Plan Note (Signed)
Not at goal based on December evaluation. We try to back off on her Crestor to see if that helped some of her symptoms, but I'm not sure if it did. Not able to achieve target lipids by next check, I would consider referral to our lipid clinic for PCSK9 inhibitor.  - Having looking at her labs from December, I will recheck some now (will have to call her) For now continue with fish oil, co-every 10 and current dose of Crestor. Adjust based on Labs that we will call her to have rechecked.

## 2016-09-14 NOTE — Assessment & Plan Note (Signed)
No active angina symptoms. Only has dyspnea which we have evaluated in great detail. She is only on aspirin and statin. Not on beta blocker or or ARB because of borderline hypotension. With no active symptoms, I would not try to add back his medications.

## 2017-09-21 ENCOUNTER — Telehealth: Payer: Self-pay | Admitting: Pharmacist

## 2017-09-21 ENCOUNTER — Ambulatory Visit (INDEPENDENT_AMBULATORY_CARE_PROVIDER_SITE_OTHER): Payer: Medicare Other | Admitting: Cardiology

## 2017-09-21 VITALS — BP 114/76 | HR 67 | Ht 66.0 in | Wt 162.4 lb

## 2017-09-21 DIAGNOSIS — I2510 Atherosclerotic heart disease of native coronary artery without angina pectoris: Secondary | ICD-10-CM | POA: Diagnosis not present

## 2017-09-21 DIAGNOSIS — E785 Hyperlipidemia, unspecified: Secondary | ICD-10-CM

## 2017-09-21 DIAGNOSIS — J449 Chronic obstructive pulmonary disease, unspecified: Secondary | ICD-10-CM

## 2017-09-21 DIAGNOSIS — Z9861 Coronary angioplasty status: Secondary | ICD-10-CM | POA: Diagnosis not present

## 2017-09-21 DIAGNOSIS — R0609 Other forms of dyspnea: Secondary | ICD-10-CM | POA: Diagnosis not present

## 2017-09-21 NOTE — Progress Notes (Signed)
PCP: Chesley Noon, MD  Clinic Note: Chief Complaint  Patient presents with  . Follow-up    No complaints  . Coronary Artery Disease    No active angina    HPI: Kim Herman is a 67 y.o. female with a PMH below who presents today for annual follow-up for CAD-PCI dating back to 2011 when she had unstable angina while vacationing in [place]. She actually "winters" in [place] in [place].   CAD history:  Unstable angina December 2011 while on vacation in [place].:  Cardiac cath showed 90% RCA -> 2 overlapping Promus DES stents May 2015 CP X4 exertional dyspnea - Peak VO2 91% obstructive pulmonary disease -> Referred to pulmonary medicine who did not think that her level of dyspnea was consistent with her COPD. -> Myoview negative for ischemia  Echo June 2015 showed normal function EF 08% with diastolic dysfunction grade 1.  Kim Herman was last seen in June 2018, doing well.  Recent Hospitalizations: None  Studies Personally Reviewed - (if available, images/films reviewed: From Epic Chart or Care Everywhere)  None  Interval History: Kim Herman returns here today doing very well.  She has no major complaints.  She still is active walking routinely and does yoga at least once or twice a week.  The only time she notes shortness of breath is what her asthma exam during allergy season or during the wintertime.  She tries to do her best to avoid cold or hot weather.  She has not had any further episodes of angina since her PCI.  She has no heart failure symptoms of PND, orthopnea or edema. She denies any rapid irregular heartbeats palpitations.  No lightheadedness or dizziness.  No syncope/near syncope or TIA/emesis fugax.  No melena, hematochezia, hematuria or epistaxis.  No claudication.   Review of Systems  Constitutional: Negative.  Negative for malaise/fatigue.  HENT: Positive for congestion. Negative for nosebleeds.   Eyes: Negative.   Respiratory: Positive for  wheezing (Only  when her asthma kicks in). Negative for shortness of breath.   Genitourinary: Negative for frequency.  Musculoskeletal: Negative for myalgias.  Neurological: Negative for dizziness, focal weakness and weakness.  Endo/Heme/Allergies: Positive for environmental allergies.  Psychiatric/Behavioral: Negative.   All other systems reviewed and are negative.   Pertinent symptoms noted in HPI  I have reviewed and (if needed) personally updated the patient's problem list, medications, allergies, past medical and surgical history, social and family history.   Past Medical History:  Diagnosis Date  . Anxiety   . Bruises easily   . CAD S/P percutaneous coronary angioplasty 02/2010   s/p RCA stents 12/11, with another artery with 30 and 40% blockage  . Depression   . Dyslipidemia, goal LDL below 70    Due to CAD  . Gastroesophageal reflux   . Hypercalcemia   . Hyperlipidemia   . Hyperparathyroidism   . Hypertension   . Hypothyroidism   . Osteoporosis    DEXA 12/2010  . PONV (postoperative nausea and vomiting)   . Shingles 3/11  . Vitamin D deficiency     Past Surgical History:  Procedure Laterality Date  . CARDIAC CATHETERIZATION    . cardiac stents  02/2010   RCA  . CORONARY ANGIOPLASTY  03/05/2010   Stenting of the proximal RCA with a 3.0 and a 3.5 Promus stent, followed by 3.5 post-dilation balloon.   . MET/CPET  08/2011   showed mildly submaximal effort of 0.95 greater than 1.09 is maximal  effort. Her peak V02 was 91%.   Marland Kitchen NM MYOVIEW LTD  08/12/2013   EF 78%, normal LV function. No ischemia or infarction.  Marland Kitchen PARATHYROIDECTOMY  05/22/2011   Procedure: PARATHYROIDECTOMY;  Surgeon: Earnstine Regal, MD;  Location: [facility];  Service: General;  Laterality: [name];  . TRANSTHORACIC ECHOCARDIOGRAM  2012; June 2015   a. 2 D Echo with EF of 64%. Normal LV  size and function, normal diastolic function. No valvular lesions, just mild MR;; b. EF 65-70%. GR 1 DD  . TUBAL LIGATION   1992    Current Meds  Medication Sig  . acyclovir (ZOVIRAX) 400 MG tablet Take 400 mg by mouth 2 (two) times daily as needed. Outbreak   . aspirin 81 MG tablet Take 81 mg by mouth daily.  . cetirizine (ZYRTEC) 10 MG tablet Take 10 mg by mouth daily.  . Cholecalciferol (VITAMIN D3) 2000 UNITS TABS Take 2,000 Units by mouth daily.   Marland Kitchen co-enzyme Q-10 30 MG capsule Take 30 mg by mouth daily.  Marland Kitchen escitalopram (LEXAPRO) 10 MG tablet Once daily  . Esomeprazole Magnesium (NEXIUM PO) Take 40 mg by mouth daily.   . Fluticasone Furoate-Vilanterol (BREO ELLIPTA) 100-25 MCG/INH AEPB Inhale 1 puff into the lungs daily.  Marland Kitchen levothyroxine (SYNTHROID, LEVOTHROID) 75 MCG tablet Take 75 mcg by mouth at bedtime.   . nitroGLYCERIN (NITROSTAT) 0.4 MG SL tablet Place 1 tablet (0.4 mg total) under the tongue every 5 (five) minutes as needed. Chest pain  . rosuvastatin (CRESTOR) 10 MG tablet Take by mouth daily.    Allergies  Allergen Reactions  . Codeine Nausea Only  . Sulfa Antibiotics Itching    Hands only, per patient. Happened in childhood.    Social History   Tobacco Use  . Smoking status: Never Smoker  . Smokeless tobacco: Never Used  Substance Use Topics  . Alcohol use: Yes    Alcohol/week: 2.4 oz    Types: 4 Glasses of wine per week  . Drug use: No   Social History   Social History Narrative   She is widowed, now back in a relationship, drinks social alcohol, she used to exercise 5 days a week for about 30 minutes at a time walking, but not anymore.   does not smoke.    family history includes Aneurysm in her father; Bladder Cancer in her brother; Cancer in her mother; Dementia in her mother; Depression in her sister; Diabetes in her brother; Diabetes type II in her mother; Hyperlipidemia in her brother; Hyperparathyroidism in her brother; Hypertension in her father and mother.  Wt Readings from Last 3 Encounters:  09/21/17 162 lb 6.4 oz (73.7 kg)  09/11/16 166 lb (75.3 kg)  09/11/15  165 lb (74.8 kg)    PHYSICAL EXAM BP 114/76   Pulse 67   Ht _0  (1.676 m)   Wt 162 lb 6.4 oz (73.7 kg)   BMI 26.21 kg/m  Physical Exam  Constitutional: She is oriented to person, place, and time. She appears well-developed and well-nourished. No distress.  Healthy-appearing.  Well-groomed.  HENT:  Head: Normocephalic and atraumatic.  Eyes: EOM are normal.  Not wearing her glasses today.  Neck: Normal range of motion. Neck supple. No hepatojugular reflux and no JVD present. Carotid bruit is not present. No tracheal deviation present.  Cardiovascular: Normal rate, regular rhythm, normal heart sounds and intact distal pulses. PMI is not displaced. Exam reveals no gallop and no friction rub.  Pulmonary/Chest: Effort normal and breath sounds  normal. No respiratory distress. She has no wheezes. She has no rales.  Abdominal: Soft. Bowel sounds are normal. She exhibits no distension. There is no tenderness. There is no rebound.  No HSM  Musculoskeletal: Normal range of motion. She exhibits no edema.  Lymphadenopathy:    She has no cervical adenopathy.  Neurological: She is alert and oriented to person, place, and time.  Psychiatric: She has a normal mood and affect. Her behavior is normal. Judgment and thought content normal.  Vitals reviewed.   Adult ECG Report  Rate: 67 ;  Rhythm: normal sinus rhythm and Normal axis, intervals and durations.  No abnormal ST and T wave segments.;   Narrative Interpretation: Normal/stable EKG   Other studies Reviewed: Additional studies/ records that were reviewed today include:  Recent Labs:  From Care Everywhere  Date 08/25/2017 03/19/2016 03/09/2015  TC 214 (H) 207 (H) 168  TG 74 57 69  HDL 77 82 77  LDLc 122 (H) 114 (H) 77  08/25/2017  Na+ 137, K+ 4.4, Cl- 100, HCO3- 23 , BUN 12, Cr 0.78, Glu 89; AST 17, ALT 10, AlkP 82  CBC: W 5.9, H/H 15.3/44.0, Plt 287;; TSH 3.79   ASSESSMENT / PLAN: Problem List Items Addressed This Visit     Dyslipidemia, goal LDL below 70 (Chronic)    Try to back off on her statin because of significant myalgia symptoms.  She is taking co-Q10, but this does not really help.  She is currently taking it every other day and is still not close to goal.  Her LDL is now up to 122 -> will likely need additional help.  I have consulted our pharmacy team from our [facility] ([facility]) to discuss possibility of PCSK-9 inhibitor      DOE (dyspnea on exertion) (Chronic)    Multifactorial probably more related to lung disease and deconditioning. We have evaluated in the past with a CPX and echocardiogram as well as Myoview   All of which would argue against cardiac etiology.  Continue to recommend exercise.      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  No longer on Plavix.  Is on aspirin and statin.  Not on beta-blocker or ARB because of hypotension in the past and bradycardia. As she is doing so well her blood pressure looks great, I would not try to add them back.      Relevant Orders   EKG 12-Lead (Completed)      I spent a total of 30 minutes with the patient and chart review. >  50% of the time was spent in direct patient consultation.   Current medicines are reviewed at length with the patient today.  (+/- concerns) really cannot tolerate any more statin dose. The following changes have been made:  met with [facility] today  Patient Instructions  NO CHANGES  WITH MEDICATIONS   CONVERSATION WITH PHARMACIST  TODAY - ABOUT CHOLESTEROL.  Your physician wants you to follow-up in [place].  You will receive a reminder letter in the mail two months in advance. If you don't receive a letter, please call our office to schedule the  follow-up appointment.    If you need a refill on your cardiac  medications before your next appointment, please call your pharmacy.     Studies Ordered:   Orders Placed This Encounter  Procedures  . EKG 12-Lead      Glenetta Hew, M.D., M.S. Interventional Cardiologist   Pager # [phone] Phone # [phone] [address] [place], [place] [address]   Thank you for choosing [facility] at [facility]!!

## 2017-09-21 NOTE — Telephone Encounter (Signed)
Crestor 40mg  daily - muscle pain/leg cramps Crestor 20mg  daily - severe muscle pain Crestor 10mg  daily - severe leg pain and cramps Crestor 10mg  every other day - lack appropriate response  Pravastatin 80mg  - per history ; unable to remember reason to stop.

## 2017-09-21 NOTE — Patient Instructions (Addendum)
NO CHANGES  WITH MEDICATIONS   CONVERSATION WITH PHARMACIST  TODAY - ABOUT CHOLESTEROL.  Your physician wants you to follow-up in [place].  You will receive a reminder letter in the mail two months in advance. If you don't receive a letter, please call our office to schedule the follow-up appointment.    If you need a refill on your cardiac medications before your next appointment, please call your pharmacy.

## 2017-09-24 ENCOUNTER — Encounter: Payer: Self-pay | Admitting: Cardiology

## 2017-09-24 NOTE — Assessment & Plan Note (Signed)
Multifactorial probably more related to lung disease and deconditioning. We have evaluated in the past with a CPX and echocardiogram as well as Myoview   All of which would argue against cardiac etiology.  Continue to recommend exercise.

## 2017-09-24 NOTE — Assessment & Plan Note (Signed)
Try to back off on her statin because of significant myalgia symptoms.  She is taking co-Q10, but this does not really help.  She is currently taking it every other day and is still not close to goal.  Her LDL is now up to 122 -> will likely need additional help.  I have consulted our pharmacy team from our [facility] ([facility]) to discuss possibility of PCSK-9 inhibitor

## 2017-09-24 NOTE — Assessment & Plan Note (Signed)
No recurrent anginal symptoms since her PCI.  No longer on Plavix.  Is on aspirin and statin.  Not on beta-blocker or ARB because of hypotension in the past and bradycardia. As she is doing so well her blood pressure looks great, I would not try to add them back.

## 2017-10-02 ENCOUNTER — Other Ambulatory Visit: Payer: Self-pay | Admitting: Pharmacist

## 2017-10-02 MED ORDER — ALIROCUMAB 150 MG/ML ~~LOC~~ SOPN: 150.0000 mg | PEN_INJECTOR | SUBCUTANEOUS | 11 refills | Status: DC

## 2017-10-02 NOTE — Telephone Encounter (Signed)
Prior authorization for Praluent 150mg  approved by insurance. 1st Rx sent to local [facility] as requested by patient.

## 2017-10-29 ENCOUNTER — Telehealth: Payer: Self-pay | Admitting: Pharmacist

## 2017-10-29 NOTE — Telephone Encounter (Signed)
Patient approved for PASS programs. Will receive Praluent 150mg  free of charge until 03/30/2018.  LMOM; patient to call back and report date of 1st injection. Plan f/u lipid panel around 5th or 6th injection.

## 2018-02-22 ENCOUNTER — Telehealth: Payer: Self-pay | Admitting: Pharmacist

## 2018-02-22 MED ORDER — ROSUVASTATIN CALCIUM 10 MG PO TABS: 5.0000 mg | ORAL_TABLET | ORAL | 1 refills | Status: DC

## 2018-02-22 NOTE — Telephone Encounter (Signed)
LDL-c down to 93mg /dL with Praluent monotherapy. Will add rosuvastatin 5mg  weekly and repeat fasting lipid panel in 2 months to re-assess therapy.

## 2019-01-31 ENCOUNTER — Other Ambulatory Visit: Payer: Self-pay

## 2019-01-31 DIAGNOSIS — E785 Hyperlipidemia, unspecified: Secondary | ICD-10-CM

## 2019-02-03 ENCOUNTER — Other Ambulatory Visit: Payer: Self-pay

## 2019-02-03 ENCOUNTER — Encounter: Payer: Self-pay | Admitting: Cardiology

## 2019-02-03 ENCOUNTER — Ambulatory Visit (INDEPENDENT_AMBULATORY_CARE_PROVIDER_SITE_OTHER): Payer: Medicare Other | Admitting: Cardiology

## 2019-02-03 VITALS — BP 105/62 | HR 85 | Temp 97.0°F | Ht 66.0 in | Wt 152.0 lb

## 2019-02-03 DIAGNOSIS — I2510 Atherosclerotic heart disease of native coronary artery without angina pectoris: Secondary | ICD-10-CM | POA: Diagnosis not present

## 2019-02-03 DIAGNOSIS — Z9861 Coronary angioplasty status: Secondary | ICD-10-CM | POA: Diagnosis not present

## 2019-02-03 DIAGNOSIS — E785 Hyperlipidemia, unspecified: Secondary | ICD-10-CM

## 2019-02-03 MED ORDER — EZETIMIBE 10 MG PO TABS: 10.0000 mg | ORAL_TABLET | Freq: Every day | ORAL | 3 refills | Status: DC

## 2019-02-03 NOTE — Patient Instructions (Signed)
Medication Instructions:  ZETIA 10 MG DAILY  START  3 DAYS  A WEEK THEN EVERY 2 WEEKS ADD A TABLET DAILY .-- UNTIL YOU ARE ABLE TO TAKE 7 DAYS OR SYMPTOME OCCUR. *If you need a refill on your cardiac medications before your next appointment, please call your pharmacy*  Lab Work: HEPATIC  LIPID IN 3 MONTHS  AFTER STARTING ZETIA   If you have labs (blood work) drawn today and your tests are completely normal, you will receive your results only by: Marland Kitchen MyChart Message (if you have MyChart) OR . A paper copy in the mail If you have any lab test that is abnormal or we need to change your treatment, we will call you to review the results.  Testing/Procedures: NOT NEEDED  Follow-Up: At [facility], you and your health needs are our priority.  As part of our continuing mission to provide you with exceptional heart care, we have created designated Provider Care Teams.  These Care Teams include your primary Cardiologist (physician) and Advanced Practice Providers (APPs -  Physician Assistants and Nurse Practitioners) who all work together to provide you with the care you need, when you need it.  Your next appointment:   12 months  The format for your next appointment:   In Person  Provider:   DR [facility]  Other Instructions

## 2019-02-03 NOTE — Progress Notes (Signed)
Primary Care Provider: Chesley Noon, MD Cardiologist: No primary care provider on file. Electrophysiologist:   Clinic Note: Chief Complaint  Patient presents with   Follow-up    Annual   Coronary Artery Disease    No angina    HPI:    Kim Herman is a 68 y.o. female with a PMH below who presents today for delayed annual f/u for CAD-PCI.  [place] in [place] in [place]. CAD History:   Unstable angina December 2011 while on vacation in [place].:   Cardiac cath showed 90% RCA -> 2 overlapping Promus DES stents  May 2015 CP X for exertional dyspnea - Peak VO2 91% obstructive pulmonary disease -> Referred to pulmonary medicine who did not think that her level of dyspnea was consistent with her COPD. -> Myoview negative for ischemia   Echo June 2015 showed normal function EF 62% with diastolic dysfunction grade 1.  Kim Herman was last seen in June 2019 --> she was doing well with no complaints.  Walking routinely and doing yoga twice a week.  She still notes dyspnea with asthma only.  Recent Hospitalizations: None  Reviewed  CV studies:    The following studies were reviewed today: (if available, images/films reviewed: From Epic Chart or Care Everywhere)  None:   Interval History:   Kim Herman returns today for delayed annual follow-up doing quite well.  No cardiac issues.  Despite Covid issues, she still remained active and has actually lost weight.  Not eating as much and exercising more..  No angina with rest or exertion.  No heart failure symptoms of PND, orthopnea or edema.  Cardiovascular review of symptoms: no chest pain or dyspnea on exertion negative for - edema, irregular heartbeat, orthopnea, palpitations, paroxysmal nocturnal dyspnea, rapid heart rate, shortness of breath or Syncope/near syncope, TIA/amaurosis fugax, claudication  The patient does not have symptoms concerning for COVID-19 infection (fever, chills, cough, or new shortness  of breath).  The patient is practicing social distancing. ++ Masking.  She does go out for Groceries/shopping. Has "distanced" gatherings with friends.   REVIEWED OF SYSTEMS   ROS: A comprehensive was performed. Review of Systems  Constitutional: Negative for malaise/fatigue and weight loss.  HENT: Negative for congestion and nosebleeds.   Respiratory: Positive for wheezing (Only when her asthma flares/allergies are troubling her). Negative for cough and shortness of breath.   Gastrointestinal: Negative for blood in stool, heartburn and melena.  Genitourinary: Negative for hematuria.  Musculoskeletal: Positive for joint pain (Routine arthritis pains). Negative for falls.  Neurological: Negative for dizziness and headaches.  Endo/Heme/Allergies: Positive for environmental allergies.  Psychiatric/Behavioral: Negative.   All other systems reviewed and are negative.  I have reviewed and (if needed) personally updated the patient's problem list, medications, allergies, past medical and surgical history, social and family history.   PAST MEDICAL HISTORY   Past Medical History:  Diagnosis Date   Anxiety    Bruises easily    CAD S/P percutaneous coronary angioplasty 02/2010   s/p RCA stents 12/11, with another artery with 30 and 40% blockage   Depression    Dyslipidemia, goal LDL below 70    Due to CAD   Gastroesophageal reflux    Hypercalcemia    Hyperlipidemia    Hyperparathyroidism    Hypertension    Hypothyroidism    Osteoporosis    DEXA 12/2010   PONV (postoperative nausea and vomiting)    Shingles 3/11   Vitamin D deficiency  PAST SURGICAL HISTORY   Past Surgical History:  Procedure Laterality Date   CARDIAC CATHETERIZATION     cardiac stents  02/2010   RCA   CORONARY ANGIOPLASTY  03/05/2010   Stenting of the proximal RCA with a 3.0 and a 3.5 Promus stent, followed by 3.5 post-dilation balloon.    MET/CPET  08/2011   showed mildly submaximal  effort of 0.95 greater than 1.09 is maximal effort. Her peak V02 was 91%.    NM MYOVIEW LTD  08/12/2013   EF 78%, normal LV function. No ischemia or infarction.   PARATHYROIDECTOMY  05/22/2011   Procedure: PARATHYROIDECTOMY;  Surgeon: Earnstine Regal, MD;  Location: [facility];  Service: General;  Laterality: [name];   TRANSTHORACIC ECHOCARDIOGRAM  2012; June 2015   a. 2 D Echo with EF of 64%. Normal LV  size and function, normal diastolic function. No valvular lesions, just mild MR;; b. EF 65-70%. GR 1 DD   TUBAL LIGATION  1992     MEDICATIONS/ALLERGIES   Current Meds  Medication Sig   acyclovir (ZOVIRAX) 400 MG tablet Take 400 mg by mouth 2 (two) times daily as needed. Outbreak    Alirocumab (PRALUENT) 150 MG/ML SOPN Inject 150 mg into the skin every 14 (fourteen) days.   aspirin 81 MG tablet Take 81 mg by mouth daily.   cetirizine (ZYRTEC) 10 MG tablet Take 10 mg by mouth daily.   Cholecalciferol (VITAMIN D3) 2000 UNITS TABS Take 2,000 Units by mouth daily.    co-enzyme Q-10 30 MG capsule Take 30 mg by mouth daily.   escitalopram (LEXAPRO) 10 MG tablet Once daily   Esomeprazole Magnesium (NEXIUM PO) Take 40 mg by mouth daily.    levothyroxine (SYNTHROID, LEVOTHROID) 75 MCG tablet Take 75 mcg by mouth at bedtime.    nitroGLYCERIN (NITROSTAT) 0.4 MG SL tablet Place 1 tablet (0.4 mg total) under the tongue every 5 (five) minutes as needed. Chest pain   rosuvastatin (CRESTOR) 10 MG tablet Take 0.5 tablets (5 mg total) by mouth once a week.   [DISCONTINUED] alendronate (FOSAMAX) 70 MG tablet TAKE 1 TABLET EVERY 7 DAYS   [DISCONTINUED] Fluticasone Furoate-Vilanterol (BREO ELLIPTA) 100-25 MCG/INH AEPB Inhale 1 puff into the lungs daily.    Allergies  Allergen Reactions   Codeine Nausea Only   Sulfa Antibiotics Itching    Hands only, per patient. Happened in childhood.     SOCIAL HISTORY/FAMILY HISTORY   Social History   Tobacco Use   Smoking status: Never Smoker    Smokeless tobacco: Never Used  Substance Use Topics   Alcohol use: Yes    Alcohol/week: 4.0 standard drinks    Types: 4 Glasses of wine per week   Drug use: No   Social History   Social History Narrative   She is widowed, now back in a relationship, drinks social alcohol, she used to exercise 5 days a week for about 30 minutes at a time walking, but not anymore.   does not smoke.    family history includes Aneurysm in her father; Bladder Cancer in her brother; Cancer in her mother; Dementia in her mother; Depression in her sister; Diabetes in her brother; Diabetes type II in her mother; Hyperlipidemia in her brother; Hyperparathyroidism in her brother; Hypertension in her father and mother.   OBJCTIVE -PE, EKG, labs   Wt Readings from Last 3 Encounters:  02/03/19 152 lb (68.9 kg)  09/21/17 162 lb 6.4 oz (73.7 kg)  09/11/16 166 lb (75.3 kg)  Physical Exam: BP 105/62    Pulse 85    Temp (!) 97 F (36.1 C)    Ht _0  (1.676 m)    Wt 152 lb (68.9 kg)    SpO2 95%    BMI 24.53 kg/m  Physical Exam  Constitutional: She is oriented to person, place, and time. She appears well-developed and well-nourished. No distress.  Healthy-appearing.  Well-groomed  HENT:  Head: Normocephalic and atraumatic.  Neck: Normal range of motion. Neck supple. No hepatojugular reflux and no JVD present. Carotid bruit is not present.  Cardiovascular: Normal rate, regular rhythm, normal heart sounds and intact distal pulses.  No extrasystoles are present. PMI is not displaced. Exam reveals no gallop and no friction rub.  No murmur heard. Pulmonary/Chest: Effort normal and breath sounds normal. No respiratory distress.  Abdominal: Soft. Bowel sounds are normal. She exhibits no distension. There is no abdominal tenderness.  Musculoskeletal: Normal range of motion.        General: No edema.  Neurological: She is alert and oriented to person, place, and time.  Psychiatric: She has a normal mood and affect.  Her behavior is normal. Judgment and thought content normal.  Vitals reviewed.   Adult ECG Report  Rate: 78 ;  Rhythm: normal sinus rhythm and Rightward axis, otherwise normal intervals and durations.;   Narrative Interpretation: Normal EKG  Recent Labs:  Nov 2020: TC 168, TG 59, HDL 69, LDL 87   ASSESSMENT/PLAN    Problem List Items Addressed This Visit    CAD S/P percutaneous coronary angioplasty - PCI of RCA (Chronic)    No recurrent anginal symptoms since PCI in December 2011. On aspirin now but no Plavix. Barely able to tolerate once weekly 5 mg rosuvastatin.  Currently on Praluent.  Blood pressure stable and has not been on ARB or beta-blocker because of hypotension and bradycardia.  As long as she is doing well, we will continue to monitor but hold off on treatment.  Blood pressure is 105/62 today would not allow [place] to restart antihypertensives.      Relevant Medications   ezetimibe (ZETIA) 10 MG tablet   Other Relevant Orders   EKG 12-Lead   Lipid panel   Hepatic function panel   Dyslipidemia, goal LDL below 70 - Primary (Chronic)    Not able to tolerate even low-dose of rosuvastatin in combination with Praluent.  Still not quite at goal although her LDL is better. Plan: Add Zetia, if this does not work on recheck, would consider Nexletol  Recheck labs in 3 months      Relevant Medications   ezetimibe (ZETIA) 10 MG tablet   Other Relevant Orders   EKG 12-Lead   Lipid panel   Hepatic function panel       COVID-19 Education: The signs and symptoms of COVID-19 were discussed with the patient and how to seek care for testing (follow up with PCP or arrange E-visit).   The importance of social distancing was discussed today.  I spent a total of 16 minutes with the patient and chart review. >  50% of the time was spent in direct patient consultation.  Additional time spent with chart review (studies, outside notes, etc): 4 Total Time: 49mn   Current medicines  are reviewed at length with the patient today.  (+/- concerns) none   Patient Instructions / Medication Changes & Studies & Tests Ordered   Patient Instructions  Medication Instructions:  ZETIA 10 MG DAILY  START  3  DAYS  A WEEK THEN EVERY 2 WEEKS ADD A TABLET DAILY .-- UNTIL YOU ARE ABLE TO TAKE 7 DAYS OR SYMPTOME OCCUR. *If you need a refill on your cardiac medications before your next appointment, please call your pharmacy*  Lab Work: HEPATIC  LIPID IN 3 MONTHS  AFTER STARTING ZETIA   If you have labs (blood work) drawn today and your tests are completely normal, you will receive your results only by:  [place] (if you have MyChart) OR  A paper copy in the mail If you have any lab test that is abnormal or we need to change your treatment, we will call you to review the results.  Testing/Procedures: NOT NEEDED  Follow-Up: At [facility], you and your health needs are our priority.  As part of our continuing mission to provide you with exceptional heart care, we have created designated Provider Care Teams.  These Care Teams include your primary Cardiologist (physician) and Advanced Practice Providers (APPs -  Physician Assistants and Nurse Practitioners) who all work together to provide you with the care you need, when you need it.  Your next appointment:   12 months  The format for your next appointment:   In Person  Provider:   DR Ellyn Hack  Other Instructions   Studies Ordered:   Orders Placed This Encounter  Procedures   Lipid panel   Hepatic function panel   EKG 12-Lead     Glenetta Hew, M.D., M.S. Interventional Cardiologist   Pager # [phone] Phone # [phone] [address] [place], [place] [address]   Thank you for choosing Heartcare at [facility]!!

## 2019-02-06 ENCOUNTER — Encounter: Payer: Self-pay | Admitting: Cardiology

## 2019-02-06 NOTE — Assessment & Plan Note (Signed)
No recurrent anginal symptoms since PCI in December 2011. On aspirin now but no Plavix. Barely able to tolerate once weekly 5 mg rosuvastatin.  Currently on Praluent.  Blood pressure stable and has not been on ARB or beta-blocker because of hypotension and bradycardia.  As long as she is doing well, we will continue to monitor but hold off on treatment.  Blood pressure is 105/62 today would not allow [place] to restart antihypertensives.

## 2019-02-06 NOTE — Assessment & Plan Note (Signed)
Not able to tolerate even low-dose of rosuvastatin in combination with Praluent.  Still not quite at goal although her LDL is better. Plan: Add Zetia, if this does not work on recheck, would consider Nexletol  Recheck labs in 3 months

## 2019-02-18 ENCOUNTER — Other Ambulatory Visit: Payer: Self-pay

## 2019-02-21 ENCOUNTER — Other Ambulatory Visit: Payer: Self-pay

## 2019-02-21 MED ORDER — PRALUENT 150 MG/ML ~~LOC~~ SOAJ
150.0000 mg | SUBCUTANEOUS | 4 refills | Status: DC
Start: 2019-02-21 — End: 2019-04-25

## 2019-02-21 NOTE — Telephone Encounter (Signed)
REFILL 

## 2019-04-25 ENCOUNTER — Telehealth: Payer: Self-pay

## 2019-04-25 MED ORDER — REPATHA SURECLICK 140 MG/ML ~~LOC~~ SOAJ: 140.0000 mg | SUBCUTANEOUS | 11 refills | Status: DC

## 2019-04-25 NOTE — Telephone Encounter (Signed)
Called and spoke w/pt about the [employer] because I was instructing her to apply. Pt voiced understanding and will apply by phone

## 2019-04-25 NOTE — Telephone Encounter (Signed)
Called and spoke w/pt regarding the approval of the [employer], rx sent, pt emailed, and pt voiced understanding

## 2019-04-25 NOTE — Addendum Note (Signed)
Addended by: Allean Found on: 04/25/2019 10:42 AM   Modules accepted: Orders

## 2019-07-06 LAB — LIPID PANEL
Chol/HDL Ratio: 1.9 ratio (ref 0.0–4.4)
Cholesterol, Total: 151 mg/dL (ref 100–199)
HDL: 81 mg/dL (ref >39)
LDL Chol Calc (NIH): 58 mg/dL (ref 0–99)
Triglycerides: 55 mg/dL (ref 0–149)
VLDL Cholesterol Cal: 12 mg/dL (ref 5–40)

## 2019-07-06 LAB — HEPATIC FUNCTION PANEL
ALT: 12 IU/L (ref 0–32)
AST: 22 IU/L (ref 0–40)
Albumin: 4.6 g/dL (ref 3.8–4.8)
Alkaline Phosphatase: 60 IU/L (ref 39–117)
Bilirubin Total: 0.7 mg/dL (ref 0.0–1.2)
Bilirubin, Direct: 0.22 mg/dL (ref 0.00–0.40)
Total Protein: 7.1 g/dL (ref 6.0–8.5)

## 2019-10-24 ENCOUNTER — Other Ambulatory Visit: Payer: Self-pay

## 2019-10-24 MED ORDER — REPATHA SURECLICK 140 MG/ML ~~LOC~~ SOAJ: 140.0000 mg | SUBCUTANEOUS | 11 refills | Status: DC

## 2020-01-19 ENCOUNTER — Other Ambulatory Visit: Payer: Self-pay | Admitting: Cardiology

## 2020-04-09 ENCOUNTER — Other Ambulatory Visit: Payer: Self-pay

## 2020-04-09 ENCOUNTER — Encounter: Payer: Self-pay | Admitting: Cardiology

## 2020-04-09 ENCOUNTER — Ambulatory Visit (INDEPENDENT_AMBULATORY_CARE_PROVIDER_SITE_OTHER): Payer: Medicare Other | Admitting: Cardiology

## 2020-04-09 VITALS — BP 127/74 | HR 72 | Ht 66.0 in | Wt 143.0 lb

## 2020-04-09 DIAGNOSIS — E785 Hyperlipidemia, unspecified: Secondary | ICD-10-CM | POA: Diagnosis not present

## 2020-04-09 DIAGNOSIS — T466X5A Adverse effect of antihyperlipidemic and antiarteriosclerotic drugs, initial encounter: Secondary | ICD-10-CM

## 2020-04-09 DIAGNOSIS — Z9861 Coronary angioplasty status: Secondary | ICD-10-CM

## 2020-04-09 DIAGNOSIS — G720 Drug-induced myopathy: Secondary | ICD-10-CM

## 2020-04-09 DIAGNOSIS — R0600 Dyspnea, unspecified: Secondary | ICD-10-CM | POA: Diagnosis not present

## 2020-04-09 DIAGNOSIS — I2510 Atherosclerotic heart disease of native coronary artery without angina pectoris: Secondary | ICD-10-CM | POA: Diagnosis not present

## 2020-04-09 DIAGNOSIS — R0609 Other forms of dyspnea: Secondary | ICD-10-CM

## 2020-04-09 NOTE — Patient Instructions (Signed)
Medication Instructions:  Not needed *If you need a refill on your cardiac medications before your next appointment, please call your pharmacy*   Lab Work: No changes      Testing/Procedures: Not needed   Follow-Up: At [facility], you and your health needs are our priority.  As part of our continuing mission to provide you with exceptional heart care, we have created designated Provider Care Teams.  These Care Teams include your primary Cardiologist (physician) and Advanced Practice Providers (APPs -  Physician Assistants and Nurse Practitioners) who all work together to provide you with the care you need, when you need it.     Your next appointment:   12 month(s)  The format for your next appointment:   In Person  Provider:   Glenetta Hew, MD   Other Instructions

## 2020-04-09 NOTE — Progress Notes (Signed)
Primary Care Provider: Chesley Noon, MD Cardiologist: No primary care provider on file. Electrophysiologist: None  Clinic Note: Chief Complaint  Patient presents with  . Follow-up    Doing well.  No issues  . Coronary Artery Disease    No angina   =========================================== ASSESSMENT/PLAN    Problem List Items Addressed This Visit    CAD S/P percutaneous coronary angioplasty - PCI of RCA - Primary (Chronic)    No recurrent angina since her PCI 2015.  She is now 6 years out and doing well.  Plan:  Continue aspirin for maintenance therapy.  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  Is now on Repatha plus Zetia.  Blood pressure is stable and has not been on beta-blocker or ARB/ACE inhibitor because of hypotension and bradycardia.      Relevant Orders   EKG 12-Lead (Completed)   Dyslipidemia, goal LDL below 70 (Chronic)    Labs reviewed from care everywhere as of this last December.  Excellent control.  LDL now finally at goal.  Zetia seems to have made the difference.  She is on Zetia plus Repatha (having converted from [place]).  PCP is following labs.  Probably okay to check annually at this point.      Statin myopathy (Chronic)    Intolerant to multiple different statin medications because of myopathy and myalgias as well as memory issues.  She is now on a stable dose of Repatha and Zetia.  Lipids look well controlled. Was unable to even tolerate 5 mg 3 days a week of rosuvastatin.      DOE (dyspnea on exertion) (Chronic)    Much less prominent now that she is lost weight.  Usually only bothers her when she is having allergies and asthma.        =====================================  HPI:    DEL WISEMAN is a 70 y.o. female with a PMH notable for CAD having PCI (while in [place] for the wintertime) who presents today for delayed annual follow-up.  Prior to [place], Jaydence's routine is that she [place] in [place] in  [place], [place].  They usually go down in [place] for longer periods of time, and commute back and forth to [place], [place].  They did not go down for as long this winter to [place] because of all the issues with COVID. CAD History:   Unstable angina December 2011 while on vacation in [place].:   Cardiac cath showed 90% RCA -> 2 overlapping Promus DES stents  May 2015 CP X for exertional dyspnea - Peak VO2 91% obstructive pulmonary disease ->Referred to pulmonary medicine who did not think that her level of dyspnea was consistent with her COPD. ->Myoview negative for ischemia   Echo June 2015 showed normal function EF 53% with diastolic dysfunction grade 1  CLETA HEATLEY was last seen on February 03, 2019-she was doing very well with no cardiac issues.  Staying active and trying to lose weight.  Not eating as much and exercising more.  Has wheezing off and on from allergy driven asthma flareups.  Otherwise not short of breath.  Started Zetia 10 mg daily-trying to increase to full 7 days at the week.  Recent Hospitalizations: None  Reviewed  CV studies:    The following studies were reviewed today: (if available, images/films reviewed: From Epic Chart or Care Everywhere) . None:  Interval History:   JENET DURIO returns today overall doing very well with no active cardiac symptoms.  She  actually had COVID-19 back in January 2021.  Major symptoms with sinus congestion poor taste that after about a week she went and got tested and was positive.  She then has become proceeded to get fully vaccinated after this infection. She has had 2 cold-like illnesses through December and January of this winter, but was not positive for COVID.  She has been having more allergy type issues triggered her asthma.  Has been a lot of social stress because of having to care for her elderly aunt and then also her partners daughter has a mental illness that is not being treated.  As a result of these social "burden", they  have not been able to do the routine winter trip to [place].  She has been quite stressed out has been having anxiety spells where she feels stomach cramping and may be occasional tightness in her chest.  Once things settle down the symptoms go away. She remains active trying to walk as frequently as possible, and has maintained a steady diet, and has lost another 10 pounds or so.  CV Review of Symptoms (Summary): no chest pain or dyspnea on exertion positive for - Allergy driven asthma attacks, anxiety attacks or stomach cramping not associated with palpitations.  Intentional weight loss. negative for - edema, irregular heartbeat, orthopnea, palpitations, paroxysmal nocturnal dyspnea, rapid heart rate or Syncope or near syncope, TIA/amaurosis fugax, claudication  The patient does not have symptoms concerning for COVID-19 infection (fever, chills, cough, or new shortness of breath).   REVIEWED OF SYSTEMS   Review of Systems  Constitutional: Positive for weight loss (Intentional). Negative for malaise/fatigue.  HENT: Positive for congestion. Negative for sinus pain.   Respiratory: Positive for cough, shortness of breath and wheezing.        Has had some asthma flareups-triggered by allergies  Cardiovascular: Negative for leg swelling.  Gastrointestinal: Negative for abdominal pain, blood in stool and melena.       Abdominal cramping associate with panic attacks  Genitourinary: Negative for hematuria.  Musculoskeletal: Negative for falls and joint pain.  Neurological: Positive for dizziness (Only when she has allergy flareups). Negative for focal weakness and weakness.  Endo/Heme/Allergies: Positive for environmental allergies.  Psychiatric/Behavioral: Negative for depression and memory loss. The patient is nervous/anxious (Very stressed.  Having panic attacks noted above). The patient does not have insomnia.    I have reviewed and (if needed) personally updated the patient's problem list,  medications, allergies, past medical and surgical history, social and family history.   PAST MEDICAL HISTORY   Past Medical History:  Diagnosis Date  . Anxiety   . Bruises easily   . CAD S/P percutaneous coronary angioplasty 02/2010   s/p RCA stents 12/11, with another artery with 30 and 40% blockage  . Depression   . Dyslipidemia, goal LDL below 70    Due to CAD  . Gastroesophageal reflux   . Hypercalcemia   . Hyperlipidemia   . Hyperparathyroidism   . Hypertension   . Hypothyroidism   . Osteoporosis    DEXA 12/2010  . PONV (postoperative nausea and vomiting)   . Shingles 3/11  . Vitamin D deficiency     PAST SURGICAL HISTORY   Past Surgical History:  Procedure Laterality Date  . CARDIAC CATHETERIZATION    . cardiac stents  02/2010   RCA  . CORONARY ANGIOPLASTY  03/05/2010   Stenting of the proximal RCA with a 3.0 and a 3.5 Promus stent, followed by 3.5 post-dilation balloon.   Marland Kitchen  MET/CPET  08/2011   showed mildly submaximal effort of 0.95 greater than 1.09 is maximal effort. Her peak V02 was 91%.   Marland Kitchen NM MYOVIEW LTD  08/12/2013   EF 78%, normal LV function. No ischemia or infarction.  Marland Kitchen PARATHYROIDECTOMY  05/22/2011   Procedure: PARATHYROIDECTOMY;  Surgeon: Earnstine Regal, MD;  Location: [facility];  Service: General;  Laterality: [name];  . TRANSTHORACIC ECHOCARDIOGRAM  2012; June 2015   a. 2 D Echo with EF of 64%. Normal LV  size and function, normal diastolic function. No valvular lesions, just mild MR;; b. EF 65-70%. GR 1 DD  . TUBAL LIGATION  1992    Immunization History  Administered Date(s) Administered  . Influenza Split 12/30/2011, 12/22/2013  . Influenza,inj,Quad PF,6+ Mos 11/29/2012  . Pneumococcal Polysaccharide-23 12/22/2013    MEDICATIONS/ALLERGIES   Current Meds  Medication Sig  . acyclovir (ZOVIRAX) 400 MG tablet Take 400 mg by mouth 2 (two) times daily as needed. Outbreak   . alendronate (FOSAMAX) 70 MG tablet   . aspirin 81 MG tablet Take 81 mg by  mouth daily.  . Cholecalciferol (VITAMIN D3) 2000 UNITS TABS Take 2,000 Units by mouth daily.   Marland Kitchen co-enzyme Q-10 30 MG capsule Take 30 mg by mouth daily.  Marland Kitchen escitalopram (LEXAPRO) 10 MG tablet Once daily  . Esomeprazole Magnesium (NEXIUM PO) Take 40 mg by mouth daily.   . Evolocumab (REPATHA SURECLICK) 659 MG/ML SOAJ Inject 140 mg into the skin every 14 (fourteen) days.  Marland Kitchen ezetimibe (ZETIA) 10 MG tablet TAKE 1 TABLET EVERY DAY  . levothyroxine (SYNTHROID, LEVOTHROID) 75 MCG tablet Take 75 mcg by mouth at bedtime.     Allergies  Allergen Reactions  . Codeine Nausea Only  . Sulfa Antibiotics Itching    Hands only, per patient. Happened in childhood.    SOCIAL HISTORY/FAMILY HISTORY   Reviewed in Epic:  Pertinent findings:  Social History   Tobacco Use  . Smoking status: Never Smoker  . Smokeless tobacco: Never Used  Substance Use Topics  . Alcohol use: Yes    Alcohol/week: 4.0 standard drinks    Types: 4 Glasses of wine per week  . Drug use: No   Social History   Social History Narrative   She is widowed, now back in a relationship, drinks social alcohol, she used to exercise 5 days a week for about 30 minutes at a time walking, but not anymore.   does not smoke.    OBJCTIVE -PE, EKG, labs   Wt Readings from Last 3 Encounters:  04/09/20 143 lb (64.9 kg)  02/03/19 152 lb (68.9 kg)  09/21/17 162 lb 6.4 oz (73.7 kg)    Physical Exam: BP 127/74   Pulse 72   Ht _0  (1.676 m)   Wt 143 lb (64.9 kg)   SpO2 97%   BMI 23.08 kg/m  Physical Exam Vitals reviewed.  Constitutional:      General: She is not in acute distress.    Appearance: Normal appearance. She is normal weight. She is not ill-appearing or toxic-appearing.  HENT:     Head: Normocephalic and atraumatic.  Neck:     Vascular: No carotid bruit, hepatojugular reflux or JVD.  Cardiovascular:     Rate and Rhythm: Normal rate and regular rhythm.  No extrasystoles are present.    Chest Wall: PMI is not  displaced.     Pulses: Intact distal pulses.     Heart sounds: Normal heart sounds. No murmur heard. No  friction rub. No gallop.   Pulmonary:     Effort: Pulmonary effort is normal. No respiratory distress.     Breath sounds: Normal breath sounds. No wheezing.  Chest:     Chest wall: No tenderness.  Musculoskeletal:        General: No swelling. Normal range of motion.     Cervical back: Normal range of motion and neck supple.  Skin:    General: Skin is warm and dry.  Neurological:     General: No focal deficit present.     Mental Status: She is alert and oriented to person, place, and time. Mental status is at baseline.     Cranial Nerves: No cranial nerve deficit.     Motor: No weakness.     Gait: Gait normal.  Psychiatric:        Mood and Affect: Mood normal.        Behavior: Behavior normal.        Thought Content: Thought content normal.        Judgment: Judgment normal.     Comments: She seems to be in good spirits today, but clearly has had issues with anxiety     Adult ECG Report  Rate: 72;  Rhythm: normal sinus rhythm, premature atrial contractions (PAC) and Nonspecific ST and T wave changes.  Otherwise normal axis, intervals and durations.;   Narrative Interpretation: Stable  Recent Labs:   Lipid panel Component 03/08/20 01/28/19 02/09/18  Cholesterol, Total 152 168 183  Triglycerides 72 59 107  HDL 73 69 69  VLDL Cholesterol Cal _0 LDL 65 87 93    CMP14+CBC/D/Plt+TSH Component 03/08/20 01/28/19 02/09/18  Glucose 96 83 97  BUN _1 Creatinine 0.90 - -  eGFR If NonAfrican American 65 76 67  eGFR If African American 75  88 78  BUN/Creatinine Ratio _2 Sodium 137 136 138  Potassium 4.5 5.1 4.4  Chloride 99 98 101  CO2 _3 CALCIUM 9.4 9.5 9.3  Total Protein 7.1 7.3 7.2  Albumin, Serum 4.4 4.3 4.5  Globulin, Total 2.7 3.0 2.7  Albumin/Globulin Ratio 1.6 1.4 1.7  Total Bilirubin 0.8 0.6 0.7  Alkaline Phosphatase 56  68 65  AST _4 ALT (SGPT) _5 TSH 2.980 2.840 4.270  WBC 5.3 5.2 5.2  RBC 5.03 5.12 5.10  Hemoglobin 15.3 15.3 15.5  Hematocrit 45.6 45.2 47.5High  MCV 91 88 93  MCH 30.4 29.9 30.4  MCHC 33.6 33.8 32.6  RDW 12.1 12.2 12.5  Platelet Count 271 278 298    Lab Results  Component Value Date   CHOL 151 07/06/2019   HDL 81 07/06/2019   LDLCALC 58 07/06/2019   TRIG 55 07/06/2019   CHOLHDL 1.9 07/06/2019    CBC Latest Ref Rng & Units 05/19/2011  WBC 4.0 - 10.5 K/uL 4.3  Hemoglobin 12.0 - 15.0 g/dL 14.3  Hematocrit 36.0 - 46.0 % 42.9  Platelets 150 - 400 K/uL 240    No results found for: TSH  ==========================================  COVID-19 Education: The signs and symptoms of COVID-19 were discussed with the patient and how to seek care for testing (follow up with PCP or arrange E-visit).   The importance of social distancing and COVID-19 vaccination was discussed today.  The patient is practicing social distancing & Masking.   I spent a total of 32 minutes with the patient spent in direct patient consultation.  ->  Exam portion of the visit was spent discussing her anxiety spells and how this is adjusted her social life and travels.  Therapeutic discussion about her stressful situations.  I reassured her that the symptoms she is feeling do sound more like anxiety. Additional time spent with chart review  / charting (studies, outside notes, etc): 10 min Total Time: 42 min   Current medicines are reviewed at length with the patient today.  (+/- concerns) n/a  This visit occurred during the SARS-CoV-2 public health emergency.  Safety protocols were in place, including screening questions prior to the visit, additional usage of staff PPE, and extensive cleaning of exam room while observing appropriate contact time as indicated for disinfecting solutions.  Notice: This dictation was prepared with Dragon dictation along with smaller phrase technology. Any transcriptional errors  that result from this process are unintentional and may not be corrected upon review.  Patient Instructions / Medication Changes & Studies & Tests Ordered   Patient Instructions  Medication Instructions:  Not needed *If you need a refill on your cardiac medications before your next appointment, please call your pharmacy*   Lab Work: No changes      Testing/Procedures: Not needed   Follow-Up: At [facility], you and your health needs are our priority.  As part of our continuing mission to provide you with exceptional heart care, we have created designated Provider Care Teams.  These Care Teams include your primary Cardiologist (physician) and Advanced Practice Providers (APPs -  Physician Assistants and Nurse Practitioners) who all work together to provide you with the care you need, when you need it.     Your next appointment:   12 month(s)  The format for your next appointment:   In Person  Provider:   Glenetta Hew, MD   Other Instructions    Studies Ordered:   Orders Placed This Encounter  Procedures  . EKG 12-Lead     Glenetta Hew, M.D., M.S. Interventional Cardiologist   Pager # [phone] Phone # [phone] [address] [place], [place] [address]   Thank you for choosing Heartcare at [facility]!!

## 2020-04-17 NOTE — Progress Notes (Incomplete)
Primary Care Provider: Chesley Noon, MD Cardiologist: No primary care provider on file. Electrophysiologist: None  Clinic Note: No chief complaint on file.   Problem List Items Addressed This Visit   None     HPI:    Kim Herman is a 70 y.o. female with a PMH notable for CAD having PCI (while in [place] for the wintertime) who presents today for delayed annual follow-up.  Prior to [place], Kim Herman's routine is that she [place] in [place] in [place], [place].  They usually go down in [place] for longer periods of time, and commute back and forth to [place], [place].  They did not go down for as long this winter to [place] because of all the issues with COVID. CAD History:   Unstable angina December 2011 while on vacation in [place].:   Cardiac cath showed 90% RCA -> 2 overlapping Promus DES stents  May 2015 CP X for exertional dyspnea - Peak VO2 91% obstructive pulmonary disease ->Referred to pulmonary medicine who did not think that her level of dyspnea was consistent with her COPD. ->Myoview negative for ischemia   Echo June 2015 showed normal function EF 16% with diastolic dysfunction grade 1  Kim Herman was last seen on February 03, 2019-she was doing very well with no cardiac issues.  Staying active and trying to lose weight.  Not eating as much and exercising more.  Has wheezing off and on from allergy driven asthma flareups.  Otherwise not short of breath.  Started Zetia 10 mg daily-trying to increase to full 7 days at the week.  Recent Hospitalizations: None  Reviewed  CV studies:    The following studies were reviewed today: (if available, images/films reviewed: From Epic Chart or Care Everywhere) . None:  Interval History:   Kim Herman returns today overall doing very well with no active cardiac symptoms.  She actually had COVID-19 back in January 2021.  Major symptoms with sinus congestion poor taste that after about a week she went and got tested and  was positive.  She then has become proceeded to get fully vaccinated after this infection. She has had 2 cold-like illnesses through December and January of this winter, but was not positive for COVID.  She has been having more allergy type issues triggered her asthma.  Has been a lot of social stress because of having to care for her elderly aunt and then also her partners daughter has a mental illness that is not being treated.  As a result of these social "burden", they have not been able to do the routine winter trip to [place].  She has been quite stressed out has been having anxiety spells where she feels stomach cramping and may be occasional tightness in her chest.  Once things settle down the symptoms go away. She remains active trying to walk as frequently as possible, and has maintained a steady diet, and has lost another 10 pounds or so.  CV Review of Symptoms (Summary): no chest pain or dyspnea on exertion positive for - Allergy driven asthma attacks, anxiety attacks or stomach cramping not associated with palpitations.  Intentional weight loss. negative for - edema, irregular heartbeat, orthopnea, palpitations, paroxysmal nocturnal dyspnea, rapid heart rate or Syncope or near syncope, TIA/amaurosis fugax, claudication  The patient does not have symptoms concerning for COVID-19 infection (fever, chills, cough, or new shortness of breath).   REVIEWED OF SYSTEMS   Review of Systems  Constitutional: Positive for weight  loss (Intentional). Negative for malaise/fatigue.  HENT: Positive for congestion. Negative for sinus pain.   Respiratory: Positive for cough, shortness of breath and wheezing.        Has had some asthma flareups-triggered by allergies  Cardiovascular: Negative for leg swelling.  Gastrointestinal: Negative for abdominal pain, blood in stool and melena.       Abdominal cramping associate with panic attacks  Genitourinary: Negative for hematuria.  Musculoskeletal:  Negative for falls and joint pain.  Neurological: Positive for dizziness (Only when she has allergy flareups). Negative for focal weakness and weakness.  Endo/Heme/Allergies: Positive for environmental allergies.  Psychiatric/Behavioral: Negative for depression and memory loss. The patient is nervous/anxious (Very stressed.  Having panic attacks noted above). The patient does not have insomnia.    I have reviewed and (if needed) personally updated the patient's problem list, medications, allergies, past medical and surgical history, social and family history.   PAST MEDICAL HISTORY   Past Medical History:  Diagnosis Date  . Anxiety   . Bruises easily   . CAD S/P percutaneous coronary angioplasty 02/2010   s/p RCA stents 12/11, with another artery with 30 and 40% blockage  . Depression   . Dyslipidemia, goal LDL below 70    Due to CAD  . Gastroesophageal reflux   . Hypercalcemia   . Hyperlipidemia   . Hyperparathyroidism   . Hypertension   . Hypothyroidism   . Osteoporosis    DEXA 12/2010  . PONV (postoperative nausea and vomiting)   . Shingles 3/11  . Vitamin D deficiency     PAST SURGICAL HISTORY   Past Surgical History:  Procedure Laterality Date  . CARDIAC CATHETERIZATION    . cardiac stents  02/2010   RCA  . CORONARY ANGIOPLASTY  03/05/2010   Stenting of the proximal RCA with a 3.0 and a 3.5 Promus stent, followed by 3.5 post-dilation balloon.   . MET/CPET  08/2011   showed mildly submaximal effort of 0.95 greater than 1.09 is maximal effort. Her peak V02 was 91%.   Marland Kitchen NM MYOVIEW LTD  08/12/2013   EF 78%, normal LV function. No ischemia or infarction.  Marland Kitchen PARATHYROIDECTOMY  05/22/2011   Procedure: PARATHYROIDECTOMY;  Surgeon: Earnstine Regal, MD;  Location: [facility];  Service: General;  Laterality: [name];  . TRANSTHORACIC ECHOCARDIOGRAM  2012; June 2015   a. 2 D Echo with EF of 64%. Normal LV  size and function, normal diastolic function. No valvular lesions, just mild MR;; b.  EF 65-70%. GR 1 DD  . TUBAL LIGATION  1992    Immunization History  Administered Date(s) Administered  . Influenza Split 12/30/2011, 12/22/2013  . Influenza,inj,Quad PF,6+ Mos 11/29/2012  . Pneumococcal Polysaccharide-23 12/22/2013    MEDICATIONS/ALLERGIES   Current Meds  Medication Sig  . acyclovir (ZOVIRAX) 400 MG tablet Take 400 mg by mouth 2 (two) times daily as needed. Outbreak   . alendronate (FOSAMAX) 70 MG tablet   . aspirin 81 MG tablet Take 81 mg by mouth daily.  . Cholecalciferol (VITAMIN D3) 2000 UNITS TABS Take 2,000 Units by mouth daily.   Marland Kitchen co-enzyme Q-10 30 MG capsule Take 30 mg by mouth daily.  Marland Kitchen escitalopram (LEXAPRO) 10 MG tablet Once daily  . Esomeprazole Magnesium (NEXIUM PO) Take 40 mg by mouth daily.   . Evolocumab (REPATHA SURECLICK) 914 MG/ML SOAJ Inject 140 mg into the skin every 14 (fourteen) days.  Marland Kitchen ezetimibe (ZETIA) 10 MG tablet TAKE 1 TABLET EVERY DAY  . levothyroxine (  SYNTHROID, LEVOTHROID) 75 MCG tablet Take 75 mcg by mouth at bedtime.     Allergies  Allergen Reactions  . Codeine Nausea Only  . Sulfa Antibiotics Itching    Hands only, per patient. Happened in childhood.    SOCIAL HISTORY/FAMILY HISTORY   Reviewed in Epic:  Pertinent findings:  Social History   Tobacco Use  . Smoking status: Never Smoker  . Smokeless tobacco: Never Used  Substance Use Topics  . Alcohol use: Yes    Alcohol/week: 4.0 standard drinks    Types: 4 Glasses of wine per week  . Drug use: No   Social History   Social History Narrative   She is widowed, now back in a relationship, drinks social alcohol, she used to exercise 5 days a week for about 30 minutes at a time walking, but not anymore.   does not smoke.    OBJCTIVE -PE, EKG, labs   Wt Readings from Last 3 Encounters:  04/09/20 143 lb (64.9 kg)  02/03/19 152 lb (68.9 kg)  09/21/17 162 lb 6.4 oz (73.7 kg)    Physical Exam: BP 127/74   Pulse 72   Ht '5\' 6"'  (1.676 m)   Wt 143 lb (64.9 kg)    SpO2 97%   BMI 23.08 kg/m  Physical Exam Vitals reviewed.  Constitutional:      General: She is not in acute distress.    Appearance: Normal appearance. She is normal weight. She is not ill-appearing or toxic-appearing.  HENT:     Head: Normocephalic and atraumatic.  Neck:     Vascular: No carotid bruit, hepatojugular reflux or JVD.  Cardiovascular:     Rate and Rhythm: Normal rate and regular rhythm.  No extrasystoles are present.    Chest Wall: PMI is not displaced.     Pulses: Intact distal pulses.     Heart sounds: Normal heart sounds. No murmur heard. No friction rub. No gallop.   Pulmonary:     Effort: Pulmonary effort is normal. No respiratory distress.     Breath sounds: Normal breath sounds. No wheezing.  Chest:     Chest wall: No tenderness.  Musculoskeletal:        General: No swelling. Normal range of motion.     Cervical back: Normal range of motion and neck supple.  Skin:    General: Skin is warm and dry.  Neurological:     General: No focal deficit present.     Mental Status: She is alert and oriented to person, place, and time. Mental status is at baseline.     Cranial Nerves: No cranial nerve deficit.     Motor: No weakness.     Gait: Gait normal.  Psychiatric:        Mood and Affect: Mood normal.        Behavior: Behavior normal.        Thought Content: Thought content normal.        Judgment: Judgment normal.     Comments: She seems to be in good spirits today, but clearly has had issues with anxiety     Adult ECG Report  Rate: 72;  Rhythm: normal sinus rhythm, premature atrial contractions (PAC) and Nonspecific ST and T wave changes.  Otherwise normal axis, intervals and durations.;   Narrative Interpretation: Stable  Recent Labs:   Lipid panel Component 03/08/20 01/28/19 02/09/18  Cholesterol, Total 152 168 183  Triglycerides 72 59 107  HDL 73 69 69  VLDL Cholesterol Cal 14  12 21  LDL 65 87 93    CMP14+CBC/D/Plt+TSH Component 03/08/20  01/28/19 02/09/18  Glucose 96 83 97  BUN '9 15 11  ' Creatinine 0.90 - -  eGFR If NonAfrican American 65 76 67  eGFR If African American 75  88 78  BUN/Creatinine Ratio '10 19 12  ' Sodium 137 136 138  Potassium 4.5 5.1 4.4  Chloride 99 98 101  CO2 '22 23 23  ' CALCIUM 9.4 9.5 9.3  Total Protein 7.1 7.3 7.2  Albumin, Serum 4.4 4.3 4.5  Globulin, Total 2.7 3.0 2.7  Albumin/Globulin Ratio 1.6 1.4 1.7  Total Bilirubin 0.8 0.6 0.7  Alkaline Phosphatase 56  68 65  AST '18 23 17  ' ALT (SGPT) '12 11 13  ' TSH 2.980 2.840 4.270  WBC 5.3 5.2 5.2  RBC 5.03 5.12 5.10  Hemoglobin 15.3 15.3 15.5  Hematocrit 45.6 45.2 47.5High  MCV 91 88 93  MCH 30.4 29.9 30.4  MCHC 33.6 33.8 32.6  RDW 12.1 12.2 12.5  Platelet Count 271 278 298    Lab Results  Component Value Date   CHOL 151 07/06/2019   HDL 81 07/06/2019   LDLCALC 58 07/06/2019   TRIG 55 07/06/2019   CHOLHDL 1.9 07/06/2019    CBC Latest Ref Rng & Units 05/19/2011  WBC 4.0 - 10.5 K/uL 4.3  Hemoglobin 12.0 - 15.0 g/dL 14.3  Hematocrit 36.0 - 46.0 % 42.9  Platelets 150 - 400 K/uL 240    No results found for: TSH  ASSESSMENT/PLAN    Problem List Items Addressed This Visit   None      COVID-19 Education: The signs and symptoms of COVID-19 were discussed with the patient and how to seek care for testing (follow up with PCP or arrange E-visit).   The importance of social distancing and COVID-19 vaccination was discussed today. *** min The patient {ACTION; IS/IS [id number]} practicing social distancing & Masking.   I spent a total of ***minutes with the patient spent in direct patient consultation.  Additional time spent with chart review  / charting (studies, outside notes, etc): *** min Total Time: *** min   Current medicines are reviewed at length with the patient today.  (+/- concerns) ***  This visit occurred during the SARS-CoV-2 public health emergency.  Safety protocols were in place, including screening questions prior  to the visit, additional usage of staff PPE, and extensive cleaning of exam room while observing appropriate contact time as indicated for disinfecting solutions.  Notice: This dictation was prepared with Dragon dictation along with smaller phrase technology. Any transcriptional errors that result from this process are unintentional and may not be corrected upon review.  Patient Instructions / Medication Changes & Studies & Tests Ordered   There are no Patient Instructions on file for this visit.   Studies Ordered:   No orders of the defined types were placed in this encounter.    Kim Herman, M.D., M.S. Interventional Cardiologist   Pager # [phone] Phone # [phone] [address] [place], [place] [address]   Thank you for choosing [facility] at [facility]!!

## 2020-04-18 ENCOUNTER — Encounter: Payer: Self-pay | Admitting: Cardiology

## 2020-04-18 DIAGNOSIS — G720 Drug-induced myopathy: Secondary | ICD-10-CM | POA: Insufficient documentation

## 2020-04-18 DIAGNOSIS — T466X5A Adverse effect of antihyperlipidemic and antiarteriosclerotic drugs, initial encounter: Secondary | ICD-10-CM | POA: Insufficient documentation

## 2020-04-18 NOTE — Assessment & Plan Note (Signed)
No recurrent angina since her PCI 2015.  She is now 6 years out and doing well.  Plan:  Continue aspirin for maintenance therapy.  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  Is now on Repatha plus Zetia.  Blood pressure is stable and has not been on beta-blocker or ARB/ACE inhibitor because of hypotension and bradycardia.

## 2020-04-18 NOTE — Assessment & Plan Note (Signed)
Labs reviewed from care everywhere as of this last December.  Excellent control.  LDL now finally at goal.  Zetia seems to have made the difference.  She is on Zetia plus Repatha (having converted from [facility]).  PCP is following labs.  Probably okay to check annually at this point.

## 2020-04-18 NOTE — Assessment & Plan Note (Signed)
Much less prominent now that she is lost weight.  Usually only bothers her when she is having allergies and asthma.

## 2020-04-18 NOTE — Assessment & Plan Note (Signed)
Intolerant to multiple different statin medications because of myopathy and myalgias as well as memory issues.  She is now on a stable dose of Repatha and Zetia.  Lipids look well controlled. Was unable to even tolerate 5 mg 3 days a week of rosuvastatin.

## 2020-07-29 ENCOUNTER — Other Ambulatory Visit: Payer: Self-pay | Admitting: Cardiology

## 2021-03-13 ENCOUNTER — Ambulatory Visit: Payer: Medicare Other | Admitting: Physician Assistant

## 2021-04-09 NOTE — Progress Notes (Signed)
Cardiology Office Note:    Date:  04/10/2021   ID:  [name], DOB 09-09-1950, MRN [id number]  PCP:  Kim Noon, MD [place] Cardiologist: Kim Hew, MD   Reason for visit: 1 year follow-up  History of Present Illness:    Kim Herman is a 71 y.o. female with a hx of CAD with DES to the RCA 2011, dyslipidemia (statin intolerant) and chronic dyspnea on exertion.  She last saw Dr. Ellyn Herman in January 2022.  She mentioned a lot of social stress having to care for her elderly aunt and mental illness in the family.  She had stomach cramping and occasional chest tightness with anxiety spells.  Today, she states she is doing well.  She denies chest pain and shortness of breath.  She mentions to you go to 2 years ago, she had COVID and lost her taste.  Since then she has had weight loss.  She has regained her taste.  She had a cholecystectomy 1 month ago.  She is hoping to get her nutrition up and stabilize her weight.  She states secondary to her asthma, she cannot walk fast.  She was previously doing yoga twice a week and hopes to restart soon since she now feels better after her cholecystectomy.  She denies significant lower extremity edema, palpitations and syncope.  She has some chronic mild lightheadedness.      Past Medical History:  Diagnosis Date   Anxiety    Bruises easily    CAD S/P percutaneous coronary angioplasty 02/2010   s/p RCA stents 12/11, with another artery with 30 and 40% blockage   Depression    Dyslipidemia, goal LDL below 70    Due to CAD   Gastroesophageal reflux    Hypercalcemia    Hyperlipidemia    Hyperparathyroidism    Hypertension    Hypothyroidism    Osteoporosis    DEXA 12/2010   PONV (postoperative nausea and vomiting)    Shingles 3/11   Vitamin D deficiency     Past Surgical History:  Procedure Laterality Date   CARDIAC CATHETERIZATION     cardiac stents  02/2010   RCA   CORONARY ANGIOPLASTY  03/05/2010   Stenting  of the proximal RCA with a 3.0 and a 3.5 Promus stent, followed by 3.5 post-dilation balloon.    MET/CPET  08/2011   showed mildly submaximal effort of 0.95 greater than 1.09 is maximal effort. Her peak V02 was 91%.    NM MYOVIEW LTD  08/12/2013   EF 78%, normal LV function. No ischemia or infarction.   PARATHYROIDECTOMY  05/22/2011   Procedure: PARATHYROIDECTOMY;  Surgeon: Kim Regal, MD;  Location: [facility];  Service: General;  Laterality: [name];   TRANSTHORACIC ECHOCARDIOGRAM  2012; June 2015   a. 2 D Echo with EF of 64%. Normal LV  size and function, normal diastolic function. No valvular lesions, just mild MR;; b. EF 65-70%. GR 1 DD   TUBAL LIGATION  1992    Current Medications: Current Meds  Medication Sig   acyclovir (ZOVIRAX) 400 MG tablet Take 400 mg by mouth 2 (two) times daily as needed. Outbreak    alendronate (FOSAMAX) 70 MG tablet    aspirin 81 MG tablet Take 81 mg by mouth daily.   Cholecalciferol (VITAMIN D3) 2000 UNITS TABS Take 2,000 Units by mouth daily.    escitalopram (LEXAPRO) 10 MG tablet Once daily   Esomeprazole Magnesium (NEXIUM PO) Take 40 mg by mouth daily.  levothyroxine (SYNTHROID, LEVOTHROID) 75 MCG tablet Take 75 mcg by mouth at bedtime.    REPATHA SURECLICK 542 MG/ML SOAJ INJECT 140 MG INTO THE SKIN EVERY 14 (FOURTEEN) DAYS.     Allergies:   Codeine and Sulfa antibiotics   Social History   Socioeconomic History   Marital status: Married    Spouse name: Not on file   Number of children: Not on file   Years of education: Not on file   Highest education level: Not on file  Occupational History   Occupation: retired  Tobacco Use   Smoking status: Never   Smokeless tobacco: Never  Substance and Sexual Activity   Alcohol use: Yes    Alcohol/week: 4.0 standard drinks    Types: 4 Glasses of wine per week   Drug use: No   Sexual activity: Not on file  Other Topics Concern   Not on file  Social History Narrative   She is widowed, now back in a  relationship, drinks social alcohol, she used to exercise 5 days a week for about 30 minutes at a time walking, but not anymore.   does not smoke.   Social Determinants of Health   Financial Resource Strain: Not on file  Food Insecurity: Not on file  Transportation Needs: Not on file  Physical Activity: Not on file  Stress: Not on file  Social Connections: Not on file     Family History: The patient's family history includes Aneurysm in her father; Bladder Cancer in her brother; Cancer in her mother; Dementia in her mother; Depression in her sister; Diabetes in her brother; Diabetes type II in her mother; Hyperlipidemia in her brother; Hyperparathyroidism in her brother; Hypertension in her father and mother.  ROS:   Please see the history of present illness.     EKGs/Labs/Other Studies Reviewed:    EKG:  The ekg ordered today demonstrates sinus bradycardia, rightward axis, septal infarct, heart rate 56, PR interval 170 ms, QRS duration 70 ms.  No significant changes from prior.  Recent Labs: No results found for requested labs within last 8760 hours.   Recent Lipid Panel Lab Results  Component Value Date/Time   CHOL 151 07/06/2019 11:45 AM   TRIG 55 07/06/2019 11:45 AM   HDL 81 07/06/2019 11:45 AM   LDLCALC 58 07/06/2019 11:45 AM    Physical Exam:    VS:  BP 116/78    Pulse (!) 56    Ht '5\' 6"'  (1.676 m)    Wt 124 lb 9.6 oz (56.5 kg)    SpO2 97%    BMI 20.11 kg/m    No data found.  Wt Readings from Last 3 Encounters:  04/10/21 124 lb 9.6 oz (56.5 kg)  04/09/20 143 lb (64.9 kg)  02/03/19 152 lb (68.9 kg)     GEN:  Well nourished, well developed in no acute distress HEENT: Normal NECK: No JVD; No carotid bruits CARDIAC: RRR, no murmurs, rubs, gallops RESPIRATORY:  Clear to auscultation without rales, wheezing or rhonchi  ABDOMEN: Soft, non-tender, non-distended MUSCULOSKELETAL: No edema; No deformity  SKIN: Warm and dry NEUROLOGIC:  Alert and oriented PSYCHIATRIC:   Normal affect     ASSESSMENT AND PLAN    Coronary artery disease with no angina -PCI to the RCA in 2011 -No angina.  Continue aspirin and lipid therapy.  Dyslipidemia with goal LDL less than 70 -LDL 71 in December 2022 (labs done at PCP with [facility]). -She stopped Zetia earlier in the year secondary to  leg cramping.  She feels like leg cramping is better off Zetia. -Statin intolerant with myalgias and memory issues.  Could not tolerate Crestor 5 mg 3 days a week. -Continue Repatha   Chronic dyspnea on exertion -Likely secondary to asthma.  She is not on inhalers since she feels like they did not make a difference.  Disposition - Follow-up in 1 year Dr. Ellyn Herman.        Medication Adjustments/Labs and Tests Ordered: Current medicines are reviewed at length with the patient today.  Concerns regarding medicines are outlined above.  Orders Placed This Encounter  Procedures   EKG 12-Lead   No orders of the defined types were placed in this encounter.   Patient Instructions  Medication Instructions:  No Changes *If you need a refill on your cardiac medications before your next appointment, please call your pharmacy*   Lab Work: No Labs If you have labs (blood work) drawn today and your tests are completely normal, you will receive your results only by: [place] (if you have MyChart) OR A paper copy in the mail If you have any lab test that is abnormal or we need to change your treatment, we will call you to review the results.   Testing/Procedures: No Testing   Follow-Up: At [facility], you and your health needs are our priority.  As part of our continuing mission to provide you with exceptional heart care, we have created designated Provider Care Teams.  These Care Teams include your primary Cardiologist (physician) and Advanced Practice Providers (APPs -  Physician Assistants and Nurse Practitioners) who all work together to provide you with the care you  need, when you need it.  We recommend signing up for the patient portal called "MyChart".  Sign up information is provided on this After Visit Summary.  MyChart is used to connect with patients for Virtual Visits (Telemedicine).  Patients are able to view lab/test results, encounter notes, upcoming appointments, etc.  Non-urgent messages can be sent to your provider as well.   To learn more about what you can do with MyChart, go to [email or web address].    Your next appointment:   1 year(s)  The format for your next appointment:   In Person  Provider:   Glenetta Hew, MD         Signed, Warren Lacy, PA-C  04/10/2021 11:32 AM    [place]

## 2021-04-10 ENCOUNTER — Encounter: Payer: Self-pay | Admitting: Physician Assistant

## 2021-04-10 ENCOUNTER — Other Ambulatory Visit: Payer: Self-pay

## 2021-04-10 ENCOUNTER — Ambulatory Visit (INDEPENDENT_AMBULATORY_CARE_PROVIDER_SITE_OTHER): Payer: Medicare Other | Admitting: Physician Assistant

## 2021-04-10 VITALS — BP 116/78 | HR 56 | Ht 66.0 in | Wt 124.6 lb

## 2021-04-10 DIAGNOSIS — Z9861 Coronary angioplasty status: Secondary | ICD-10-CM

## 2021-04-10 DIAGNOSIS — I2510 Atherosclerotic heart disease of native coronary artery without angina pectoris: Secondary | ICD-10-CM | POA: Diagnosis not present

## 2021-04-10 DIAGNOSIS — E785 Hyperlipidemia, unspecified: Secondary | ICD-10-CM

## 2021-04-10 NOTE — Patient Instructions (Addendum)
Medication Instructions:  No Changes *If you need a refill on your cardiac medications before your next appointment, please call your pharmacy*   Lab Work: No Labs If you have labs (blood work) drawn today and your tests are completely normal, you will receive your results only by: [place] (if you have MyChart) OR A paper copy in the mail If you have any lab test that is abnormal or we need to change your treatment, we will call you to review the results.   Testing/Procedures: No Testing   Follow-Up: At [facility], you and your health needs are our priority.  As part of our continuing mission to provide you with exceptional heart care, we have created designated Provider Care Teams.  These Care Teams include your primary Cardiologist (physician) and Advanced Practice Providers (APPs -  Physician Assistants and Nurse Practitioners) who all work together to provide you with the care you need, when you need it.  We recommend signing up for the patient portal called "MyChart".  Sign up information is provided on this After Visit Summary.  MyChart is used to connect with patients for Virtual Visits (Telemedicine).  Patients are able to view lab/test results, encounter notes, upcoming appointments, etc.  Non-urgent messages can be sent to your provider as well.   To learn more about what you can do with MyChart, go to [email or web address].    Your next appointment:   1 year(s)  The format for your next appointment:   In Person  Provider:   Glenetta Hew, MD

## 2021-05-10 ENCOUNTER — Ambulatory Visit: Payer: Medicare Other | Admitting: Cardiology

## 2021-06-18 ENCOUNTER — Encounter: Payer: Self-pay | Admitting: Cardiology

## 2021-06-18 MED ORDER — REPATHA SURECLICK 140 MG/ML ~~LOC~~ SOAJ: 140.0000 mg | SUBCUTANEOUS | 3 refills | Status: DC

## 2022-05-09 NOTE — Progress Notes (Deleted)
Cardiology Office Note:    Date:  05/09/2022   ID:  TAMMYLYNN SHANAHAN, DOB Feb 07, 1951, MRN [id number]  PCP:  Chesley Noon, MD   [place] Providers Cardiologist:  Glenetta Hew, MD { Click to update primary MD,subspecialty MD or APP then REFRESH:1}    Referring MD: Chesley Noon, MD   No chief complaint on file. ***  History of Present Illness:    Kim Herman is a 72 y.o. female with a hx of ***  Past Medical History:  Diagnosis Date   Anxiety    Bruises easily    CAD S/P percutaneous coronary angioplasty 02/2010   s/p RCA stents 12/11, with another artery with 30 and 40% blockage   Depression    Dyslipidemia, goal LDL below 70    Due to CAD   Gastroesophageal reflux    Hypercalcemia    Hyperlipidemia    Hyperparathyroidism    Hypertension    Hypothyroidism    Osteoporosis    DEXA 12/2010   PONV (postoperative nausea and vomiting)    Shingles 3/11   Vitamin D deficiency     Past Surgical History:  Procedure Laterality Date   CARDIAC CATHETERIZATION     cardiac stents  02/2010   RCA   CORONARY ANGIOPLASTY  03/05/2010   Stenting of the proximal RCA with a 3.0 and a 3.5 Promus stent, followed by 3.5 post-dilation balloon.    MET/CPET  08/2011   showed mildly submaximal effort of 0.95 greater than 1.09 is maximal effort. Her peak V02 was 91%.    NM MYOVIEW LTD  08/12/2013   EF 78%, normal LV function. No ischemia or infarction.   PARATHYROIDECTOMY  05/22/2011   Procedure: PARATHYROIDECTOMY;  Surgeon: Earnstine Regal, MD;  Location: [facility];  Service: General;  Laterality: [name];   TRANSTHORACIC ECHOCARDIOGRAM  2012; June 2015   a. 2 D Echo with EF of 64%. Normal LV  size and function, normal diastolic function. No valvular lesions, just mild MR;; b. EF 65-70%. GR 1 DD   TUBAL LIGATION  1992    Current Medications: No outpatient medications have been marked as taking for the 05/14/22 encounter (Appointment) with Ledora Bottcher, [place].      Allergies:   Codeine and Sulfa antibiotics   Social History   Socioeconomic History   Marital status: Married    Spouse name: Not on file   Number of children: Not on file   Years of education: Not on file   Highest education level: Not on file  Occupational History   Occupation: retired  Tobacco Use   Smoking status: Never   Smokeless tobacco: Never  Substance and Sexual Activity   Alcohol use: Yes    Alcohol/week: 4.0 standard drinks of alcohol    Types: 4 Glasses of wine per week   Drug use: No   Sexual activity: Not on file  Other Topics Concern   Not on file  Social History Narrative   She is widowed, now back in a relationship, drinks social alcohol, she used to exercise 5 days a week for about 30 minutes at a time walking, but not anymore.   does not smoke.   Social Determinants of Health   Financial Resource Strain: Not on file  Food Insecurity: Not on file  Transportation Needs: Not on file  Physical Activity: Not on file  Stress: Not on file  Social Connections: Not on file     Family History: The patient's ***  family history includes Aneurysm in her father; Bladder Cancer in her brother; Cancer in her mother; Dementia in her mother; Depression in her sister; Diabetes in her brother; Diabetes type II in her mother; Hyperlipidemia in her brother; Hyperparathyroidism in her brother; Hypertension in her father and mother.  ROS:   Please see the history of present illness.    *** All other systems reviewed and are negative.  EKGs/Labs/Other Studies Reviewed:    The following studies were reviewed today: ***  EKG:  EKG is *** ordered today.  The ekg ordered today demonstrates ***  Recent Labs: No results found for requested labs within last 365 days.  Recent Lipid Panel    Component Value Date/Time   CHOL 151 07/06/2019 1145   TRIG 55 07/06/2019 1145   HDL 81 07/06/2019 1145   CHOLHDL 1.9 07/06/2019 1145   LDLCALC 58 07/06/2019 1145     Risk  Assessment/Calculations:   {Does this patient have ATRIAL FIBRILLATION?:[id number]}  No BP recorded.  {Refresh Note OR Click here to enter BP  :1}***         Physical Exam:    VS:  There were no vitals taken for this visit.    Wt Readings from Last 3 Encounters:  04/10/21 124 lb 9.6 oz (56.5 kg)  04/09/20 143 lb (64.9 kg)  02/03/19 152 lb (68.9 kg)     GEN: *** Well nourished, well developed in no acute distress HEENT: Normal NECK: No JVD; No carotid bruits LYMPHATICS: No lymphadenopathy CARDIAC: ***RRR, no murmurs, rubs, gallops RESPIRATORY:  Clear to auscultation without rales, wheezing or rhonchi  ABDOMEN: Soft, non-tender, non-distended MUSCULOSKELETAL:  No edema; No deformity  SKIN: Warm and dry NEUROLOGIC:  Alert and oriented x 3 PSYCHIATRIC:  Normal affect   ASSESSMENT:    No diagnosis found. PLAN:    In order of problems listed above:  ***      {Are you ordering a CV Procedure (e.g. stress test, cath, DCCV, TEE, etc)?   Press F2        :[id number]    Medication Adjustments/Labs and Tests Ordered: Current medicines are reviewed at length with the patient today.  Concerns regarding medicines are outlined above.  No orders of the defined types were placed in this encounter.  No orders of the defined types were placed in this encounter.   There are no Patient Instructions on file for this visit.   Signed, Ledora Bottcher, PA  05/09/2022 3:50 PM    [facility]

## 2022-05-13 NOTE — Progress Notes (Signed)
Cardiology Clinic Note   Patient Name: Kim Herman Date of Encounter: 05/14/2022  Primary Care Provider:  Chesley Noon, MD Primary Cardiologist:  Glenetta Hew, MD  Patient Profile    Kim Herman is a 72 y.o. female with a past medical history of CAD s/p PCI with DES x 2 to RCA 2011, hyperlipidemia, chronic DOE who presents to the clinic today for 1 year follow-up.  Past Medical History    Past Medical History:  Diagnosis Date   Anxiety    Bruises easily    CAD S/P percutaneous coronary angioplasty 02/2010   s/p RCA stents 12/11, with another artery with 30 and 40% blockage   Depression    Dyslipidemia, goal LDL below 70    Due to CAD   Gastroesophageal reflux    Hypercalcemia    Hyperlipidemia    Hyperparathyroidism    Hypertension    Hypothyroidism    Osteoporosis    DEXA 12/2010   PONV (postoperative nausea and vomiting)    Shingles 3/11   Vitamin D deficiency    Past Surgical History:  Procedure Laterality Date   CARDIAC CATHETERIZATION     cardiac stents  02/2010   RCA   CORONARY ANGIOPLASTY  03/05/2010   Stenting of the proximal RCA with a 3.0 and a 3.5 Promus stent, followed by 3.5 post-dilation balloon.    MET/CPET  08/2011   showed mildly submaximal effort of 0.95 greater than 1.09 is maximal effort. Her peak V02 was 91%.    NM MYOVIEW LTD  08/12/2013   EF 78%, normal LV function. No ischemia or infarction.   PARATHYROIDECTOMY  05/22/2011   Procedure: PARATHYROIDECTOMY;  Surgeon: Earnstine Regal, MD;  Location: [facility];  Service: General;  Laterality: [name];   TRANSTHORACIC ECHOCARDIOGRAM  2012; June 2015   a. 2 D Echo with EF of 64%. Normal LV  size and function, normal diastolic function. No valvular lesions, just mild MR;; b. EF 65-70%. GR 1 DD   TUBAL LIGATION  1992    Allergies  Allergies  Allergen Reactions   Codeine Nausea Only   Sulfa Antibiotics Itching    Hands only, per patient. Happened in childhood.    History of Present Illness     Kim Herman has a past medical history of: CAD. [place] December 2011 (abnormal nuclear stress test) performed in [place]: RCA 99%.  PCI with overlapping DES proximal to distal RCA. Nuclear stress test 08/12/2013: Normal, low risk study. Echo 09/22/2013: EF 65 to 70%.  Grade I DD.  Mild MR. Hyperlipidemia.  Lipid panel 03/21/2022: LDL 75, HDL 72, TG 43, total 157. Chronic DOE.  Asthma.  Ms. Sclafani is a long time patient of Dr. Ellyn Hack for the above outlined medical history.  Patient was last seen in the office by Caron Presume, PA-C on 04/10/2021.  At that time she had stopped her Zetia secondary to leg cramping.  She is intolerant to statins.  She was continued on Repatha and all other medications.  Today, patient is here alone.  She is doing very well. Patient denies shortness of breath or dyspnea on exertion. No chest pain, pressure, or tightness. Denies lower extremity edema, orthopnea, or PND. No palpitations.  She stays active doing yoga twice a week.  When it is warm out she likes to walk.  She continues with mild dyspnea on exertion secondary to her asthma.  This is unchanged for years.   Home Medications    Current Meds  Medication Sig   alendronate (FOSAMAX) 70 MG tablet    ALPRAZolam (XANAX) 0.25 MG tablet Take 0.25 mg by mouth 3 (three) times daily as needed for anxiety.   aspirin 81 MG tablet Take 81 mg by mouth daily.   Cholecalciferol (VITAMIN D3) 2000 UNITS TABS Take 2,000 Units by mouth daily.    escitalopram (LEXAPRO) 10 MG tablet Once daily   escitalopram (LEXAPRO) 20 MG tablet Take 20 mg by mouth daily.   Esomeprazole Magnesium (NEXIUM PO) Take 40 mg by mouth daily.    Evolocumab (REPATHA SURECLICK) [id number] MG/ML SOAJ Inject 140 mg into the skin every 14 (fourteen) days.   levothyroxine (SYNTHROID, LEVOTHROID) 75 MCG tablet Take 75 mcg by mouth at bedtime.    MAGNESIUM PO Take 1 tablet by mouth daily in the afternoon.   omeprazole (PRILOSEC) 40 MG capsule Take 40 mg by  mouth 2 (two) times daily.    Family History    Family History  Problem Relation Age of Onset   Hypertension Father    Aneurysm Father        abdominal   Hypertension Mother    Dementia Mother    Cancer Mother        bladder   Diabetes type II Mother    Hyperlipidemia Brother    Hyperparathyroidism Brother    Depression Sister    Bladder Cancer Brother    Diabetes Brother    She indicated that her mother is alive. She indicated that her father is deceased. She indicated that only one of her two sisters is alive. She indicated that two of her six brothers are alive.   Social History    Social History   Socioeconomic History   Marital status: Married    Spouse name: Not on file   Number of children: Not on file   Years of education: Not on file   Highest education level: Not on file  Occupational History   Occupation: retired  Tobacco Use   Smoking status: Never   Smokeless tobacco: Never  Substance and Sexual Activity   Alcohol use: Yes    Alcohol/week: 4.0 standard drinks of alcohol    Types: 4 Glasses of wine per week   Drug use: No   Sexual activity: Not on file  Other Topics Concern   Not on file  Social History Narrative   She is widowed, now back in a relationship, drinks social alcohol, she used to exercise 5 days a week for about 30 minutes at a time walking, but not anymore.   does not smoke.   Social Determinants of Health   Financial Resource Strain: Not on file  Food Insecurity: Not on file  Transportation Needs: Not on file  Physical Activity: Not on file  Stress: Not on file  Social Connections: Not on file  Intimate Partner Violence: Not on file     Review of Systems    General:  No chills, fever, night sweats or weight changes.  Cardiovascular:  No chest pain, edema, orthopnea, palpitations, paroxysmal nocturnal dyspnea.  DOE secondary to asthma, unchanged for years. Dermatological: No rash, lesions/masses Respiratory: No cough,  dyspnea Urologic: No hematuria, dysuria Abdominal:   No nausea, vomiting, diarrhea, bright red blood per rectum, melena, or hematemesis Neurologic:  No visual changes, weakness, changes in mental status. All other systems reviewed and are otherwise negative except as noted above.  Physical Exam    VS:  BP 102/64 (BP Location: Left Arm, Patient Position: Sitting, Cuff  Size: Small)   Pulse (!) 57   Ht 5' 6"$  (1.676 m)   Wt 126 lb 3.2 oz (57.2 kg)   SpO2 92%   BMI 20.37 kg/m  , BMI Body mass index is 20.37 kg/m. GEN:  Well nourished, well developed, in no acute distress. HEENT: Normal. Neck: Supple, no JVD, carotid bruits, or masses. Cardiac: RRR, no murmurs, rubs, or gallops. No clubbing, cyanosis, edema.  Radials/DP/PT 2+ and equal bilaterally.  Respiratory:  Respirations regular and unlabored, clear to auscultation bilaterally. GI: Soft, nontender, nondistended. MS: No deformity or atrophy. Skin: Warm and dry, no rash. Neuro: Strength and sensation are intact. Psych: Normal affect.  Accessory Clinical Findings    Recent Labs: No results found for requested labs within last 365 days.   Recent Lipid Panel    Component Value Date/Time   CHOL 151 07/06/2019 1145   TRIG 55 07/06/2019 1145   HDL 81 07/06/2019 1145   CHOLHDL 1.9 07/06/2019 1145   LDLCALC 58 07/06/2019 1145   ECG personally reviewed by me today: Sinus bradycardia, rate 57 bpm.  No significant changes from 04/10/2021.   Assessment & Plan   CAD.  S/p overlapping DES proximal to distal RCA 2011.  Patient denies chest pain, pressure, or tightness.  She is very active doing yoga twice a week and likes to walk when it is warmer out.  Continue aspirin and Repatha. Hyperlipidemia.  LDL December 2023 75, not at goal.  Patient is intolerant to statins and Zetia.  She reports she might have missed a couple of doses of Repatha but has recently been adhering to bimonthly schedule. Chronic DOE.  Mild dyspnea on exertion  secondary to asthma.  This has been unchanged for years.  Continue to follow with PCP.  Disposition: Patient requests a longer interval between follow-up visits, as she is doing so well.  Return in 15 months or sooner as needed.   Justice Britain. Laker Thompson, DNP, NP-C     05/14/2022, 4:13 PM [facility] [address] Office [phone] Fax [phone]

## 2022-05-14 ENCOUNTER — Encounter: Payer: Self-pay | Admitting: Physician Assistant

## 2022-05-14 ENCOUNTER — Ambulatory Visit: Payer: Medicare Other | Attending: Physician Assistant | Admitting: Student

## 2022-05-14 VITALS — BP 102/64 | HR 57 | Ht 66.0 in | Wt 126.2 lb

## 2022-05-14 DIAGNOSIS — E785 Hyperlipidemia, unspecified: Secondary | ICD-10-CM

## 2022-05-14 DIAGNOSIS — R0609 Other forms of dyspnea: Secondary | ICD-10-CM | POA: Diagnosis not present

## 2022-05-14 DIAGNOSIS — I2510 Atherosclerotic heart disease of native coronary artery without angina pectoris: Secondary | ICD-10-CM

## 2022-05-14 MED ORDER — NITROGLYCERIN 0.4 MG SL SUBL: 0.4000 mg | SUBLINGUAL_TABLET | SUBLINGUAL | 2 refills | Status: DC | PRN

## 2022-05-14 NOTE — Patient Instructions (Addendum)
Medication Instructions:   Your physician recommends that you continue on your current medications as directed. Please refer to the Current Medication list given to you today.  *If you need a refill on your cardiac medications before your next appointment, please call your pharmacy*  Lab Work: NONE ordered at this time of appointment   If you have labs (blood work) drawn today and your tests are completely normal, you will receive your results only by: [place] (if you have MyChart) OR A paper copy in the mail If you have any lab test that is abnormal or we need to change your treatment, we will call you to review the results.  Testing/Procedures: NONE ordered at this time of appointment   Follow-Up: At [facility] you and your health needs are our priority.  As part of our continuing mission to provide you with exceptional heart care, we have created designated Provider Care Teams.  These Care Teams include your primary Cardiologist (physician) and Advanced Practice Providers (APPs -  Physician Assistants and Nurse Practitioners) who all work together to provide you with the care you need, when you need it.  Your next appointment:   15 month(s)  Provider:   Glenetta Hew, MD     Other Instructions

## 2022-05-30 ENCOUNTER — Other Ambulatory Visit: Payer: Self-pay | Admitting: Pharmacist Clinician (PhC)/ Clinical Pharmacy Specialist

## 2022-05-30 MED ORDER — REPATHA SURECLICK 140 MG/ML ~~LOC~~ SOAJ: 140.0000 mg | SUBCUTANEOUS | 3 refills | Status: DC

## 2022-06-17 ENCOUNTER — Other Ambulatory Visit (HOSPITAL_COMMUNITY): Payer: Self-pay

## 2022-09-12 ENCOUNTER — Telehealth: Payer: Self-pay

## 2022-09-12 ENCOUNTER — Telehealth: Payer: Self-pay | Admitting: Cardiology

## 2022-09-12 ENCOUNTER — Other Ambulatory Visit (HOSPITAL_COMMUNITY): Payer: Self-pay

## 2022-09-12 NOTE — Telephone Encounter (Signed)
Call to [facility] gave all information f patient and provider.  She cannot find the medication and states will need to transfer the call.  Advised to make a note to call our office back.

## 2022-09-12 NOTE — Telephone Encounter (Signed)
Routed to PA Rx team re: prior auth  Med requiring PA on her list: Repatha

## 2022-09-12 NOTE — Telephone Encounter (Signed)
Pharmacy Patient Advocate Encounter   Received notification from [facility] MEDICARE that prior authorization for REPATHA is needed.    PA submitted on 09/12/22 Key [id number] Status is pending  [name], CPhT Pharmacy Patient Advocate Specialist Direct Number: [phone] Fax: [phone]

## 2022-09-12 NOTE — Telephone Encounter (Signed)
Calling with questions for prior auth for patient medication. Please advise

## 2022-09-15 ENCOUNTER — Other Ambulatory Visit (HOSPITAL_COMMUNITY): Payer: Self-pay

## 2022-09-15 MED ORDER — REPATHA SURECLICK 140 MG/ML ~~LOC~~ SOAJ: 140.0000 mg | SUBCUTANEOUS | 3 refills | Status: DC

## 2022-09-15 NOTE — Telephone Encounter (Signed)
Call to patient and advised the medication was approved.  Re-sent to mail order pharmacy to ensure re run with Approval.

## 2022-09-15 NOTE — Telephone Encounter (Signed)
Routed you into separate encounter, PA is approved.

## 2022-09-15 NOTE — Addendum Note (Signed)
Addended by: Candie Chroman on: 09/15/2022 12:09 PM   Modules accepted: Orders

## 2022-09-15 NOTE — Telephone Encounter (Signed)
Pharmacy Patient Advocate Encounter  Prior Authorization for REPATHA has been approved.    Effective dates: 09/12/22 through 09/12/23  Per test claim can confirm PA has been approved  [name], CPhT Pharmacy Patient Advocate Specialist Direct Number: [phone] Fax: [phone]

## 2023-05-14 ENCOUNTER — Telehealth: Payer: Self-pay

## 2023-05-14 ENCOUNTER — Other Ambulatory Visit (HOSPITAL_COMMUNITY): Payer: Self-pay

## 2023-05-14 NOTE — Telephone Encounter (Signed)
Added patient to my healthwell portal and completed diagnosis verification.Left patient a VM making them aware it was taken care of. Encouraged patient to reach back out if they have any other questions or concerns.

## 2023-05-14 NOTE — Telephone Encounter (Signed)
Added patient to my healthwell portal and completed diagnosis verification.Left patient a VM making them aware it was taken care of. Encouraged patient to reach back out to my jabber if they have any other questions or concerns.

## 2023-05-30 ENCOUNTER — Other Ambulatory Visit: Payer: Self-pay | Admitting: Cardiology

## 2023-06-02 MED ORDER — REPATHA SURECLICK 140 MG/ML ~~LOC~~ SOAJ: 140.0000 mg | SUBCUTANEOUS | 1 refills | Status: DC

## 2023-06-02 NOTE — Addendum Note (Signed)
 Addended by: Malena Peer D on: 06/02/2023 04:55 PM   Modules accepted: Orders

## 2023-08-14 ENCOUNTER — Encounter: Payer: Self-pay | Admitting: Cardiology

## 2023-08-14 ENCOUNTER — Ambulatory Visit: Attending: Cardiology | Admitting: Cardiology

## 2023-08-14 VITALS — BP 92/52 | HR 65 | Ht 66.0 in | Wt 137.6 lb

## 2023-08-14 DIAGNOSIS — G720 Drug-induced myopathy: Secondary | ICD-10-CM

## 2023-08-14 DIAGNOSIS — I2510 Atherosclerotic heart disease of native coronary artery without angina pectoris: Secondary | ICD-10-CM

## 2023-08-14 DIAGNOSIS — E785 Hyperlipidemia, unspecified: Secondary | ICD-10-CM | POA: Diagnosis not present

## 2023-08-14 DIAGNOSIS — I9589 Other hypotension: Secondary | ICD-10-CM

## 2023-08-14 DIAGNOSIS — T466X5A Adverse effect of antihyperlipidemic and antiarteriosclerotic drugs, initial encounter: Secondary | ICD-10-CM

## 2023-08-14 DIAGNOSIS — J4489 Other specified chronic obstructive pulmonary disease: Secondary | ICD-10-CM | POA: Diagnosis not present

## 2023-08-14 DIAGNOSIS — Z9861 Coronary angioplasty status: Secondary | ICD-10-CM

## 2023-08-14 NOTE — Progress Notes (Signed)
 Cardiology Office Note:  .   Date:  08/17/2023  ID:  Kim Herman, DOB Sep 11, 1950, MRN [id number] PCP: Gerrianne Krauss, PA-C  [facility] Providers Cardiologist:  Randene Bustard, MD     Chief Complaint  Patient presents with   Follow-up   Coronary Artery Disease    No angina    Patient Profile: .     Kim Herman is a otherwise healthy appearing 73 y.o. female with a PMH notable for CAD (DES PCI RCA x 2 in 2011), HTN, HLD with chronic DOE who presents here for delayed annual follow-up at the request of Emaline Handsome, MD.  CAD: RCA PCI Cardiac Cath-PCI [facility] -> [place] ): 99% RCA, 25 to 40% LAD.  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  Normal LVEF and EDP.  => DES PCI to proximal-mid LAD (3.0 mm 50 mg 3.0 mm x 12 mm Promus DES postdilated with 3.5 mm balloon.  (02/2010) HLD => now on Repatha  Chronic DOE -> COPD suggested on CPX Asthma    I have not seen her since January 2022.  She traditionally winters in [place]  and summers in [place], [place].  She was last seen on May 14, 2022 by Morey Ar, NP.  She was on Repatha  for lipids and was doing well with no major issues.  Staying active doing yoga twice a week and walking outside most 1.  Mild exertional dyspnea secondary to asthma.  On aspirin and Repatha , but no other medications from a cardiac standpoint.  Subjective  Kim Herman returns here today for annual follow-up overall doing pretty well.  Her energy level is improved.  She still active walking and doing yoga.  No symptoms of chest pain pressure or dyspnea at rest or exertion.  She sometimes gets a little bit dizzy and lightheaded mostly orthostatic but has not had a syncope or near syncope.  No heart failure symptoms of PND, orthopnea or edema. She does note that she has had intermittent low blood pressure readings and is not on any medications to treat blood pressure.  She acknowledges that she does not always drink as much she should. Doing fairly well with  Repatha .  No major issues.  No myalgias or arthralgias.    Objective   Meds: 81 mg aspirin daily, Repatha  140 mg injection to 14 days.  Synthroid 50 mcg nightly.  Omeprazole 40 mg twice daily.  Fosamax 70 mg weekly.  Lexapro total 30 mg daily.    Studies Reviewed: Aaron Aas   EKG Interpretation Date/Time:  Friday Aug 14 2023 13:26:32 EDT Ventricular Rate:  65 PR Interval:  164 QRS Duration:  72 QT Interval:  418 QTC Calculation: 434 R Axis:   89  Text Interpretation: Normal sinus rhythm Possible Anterior infarct , age undetermined No significant change since last tracing 05/2022 Confirmed by Randene Bustard ([id number]) on 08/14/2023 1:38:39 PM     ECHO June 2015: Normal LV size and function.  Vigorous functional EF 65 to 70%.  GR 1 DD.  Mild MR. Myoview  08/12/2013: No ischemia or infarction.  Normal EF.  [facility] Related to Lipid Panel With LDL/HDL Ratio Component 03/24/23 03/21/22 03/15/21 03/08/20  Cholesterol, Total 175 157 161 152  Triglycerides 54 43 55 72  HDL 94 72 79 73  VLDL Cholesterol Cal 11 10 11 14   LDL 70 75 71 65   Component 03/24/23 03/21/22 03/21/22  Glucose 97 -- 82  BUN 10 -- 17  Creatinine 0.77 -- 0.83  eGFR 82 --  75  BUN/Creatinine Ratio 13 -- 20  Sodium 134 -- 137  Potassium 4.5 -- 4.4  Chloride 98 -- 99  CO2 23 -- 25  CALCIUM  9.4 -- 9.5  Total Protein 7.1 -- 6.8  Albumin, Serum 4.5 -- 4.2  Globulin, Total 2.6 -- 2.6  Total Bilirubin 1.0 -- 0.8  Alkaline Phosphatase 79 -- 66  AST 24 -- 21  ALT (SGPT) 12 -- 15  TSH 2.630 -- --  WBC 3.8 6.0 --  RBC 4.52 4.72 --  Hemoglobin 14.5 14.4 --  Hematocrit 43.0 43.2 --  MCV 95 92 --  MCH 32.1 30.5 --  MCHC 33.7 33.3 --  RDW 11.9 12.3 --  Platelet Count 246 250 --  Neutrophils 56 63     Risk Assessment/Calculations:         Physical Exam:   VS:  BP (!) 92/52 (BP Location: Left Arm, Patient Position: Sitting, Cuff Size: Normal)   Pulse 65   Ht 5\' 6"  (1.676 m)   Wt 137 lb 9.6 oz (62.4 kg)   SpO2 94%    BMI 22.21 kg/m    Wt Readings from Last 3 Encounters:  08/14/23 137 lb 9.6 oz (62.4 kg)  05/14/22 126 lb 3.2 oz (57.2 kg)  04/10/21 124 lb 9.6 oz (56.5 kg)    GEN: Well nourished, well groomed; in no acute distress; healthy appearing NECK: No JVD; No carotid bruits CARDIAC: Normal S1, S2; RRR, no murmurs, rubs, gallops RESPIRATORY:  Clear to auscultation without rales, wheezing or rhonchi ; nonlabored, good air movement. ABDOMEN: Soft, non-tender, non-distended EXTREMITIES:  No edema; No deformity      ASSESSMENT AND PLAN: .    Problem List Items Addressed This Visit       Cardiology Problems   CAD S/P percutaneous coronary angioplasty - PCI of RCA (Chronic)   No recurrent angina since her PCI back in 2011. GDMT limited by low blood pressures therefore not able to use beta-blocker ARB/ACE inhibitor or calcium  channel blocker. - On aspirin monotherapy for antiplatelet maintenance.  Okay to hold for procedures or surgeries. -Due to statin intolerance, no longer on statin and is on Repatha  140 mg q. 14 days.      Dyslipidemia, goal LDL below 70 (Chronic)   Lipids are now within target range with an LDL of 70 on Repatha  monotherapy.   Statin intolerance precludes the use of other statins also did not tolerate Zetia .      Hypotension (arterial)   Low baseline blood pressures.  Continue to recommend adequate hydration and can potentially liberalize salt intake.        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  No longer on therapy.      Statin myopathy (Chronic)   Intolerant of multiple statins because of myopathy and myalgias as well as memory issues.  She subsequently stopped Zetia  because of similar symptoms. Seems to be tolerating Repatha  with only mild myalgias. -Continue Repatha  140 mg every 2 weeks.      Other Visit Diagnoses       Coronary artery disease involving native coronary artery of native heart without  angina pectoris    -  Primary   Relevant Orders   EKG 12-Lead (Completed)             Follow-Up: Return in about 1 year (around 08/13/2024) for [employer].     Signed, Arleen Lacer, MD, MS Myrtie Atkinson  Addie Holstein, M.D., M.S. Interventional Chartered certified accountant  Pager # [phone]

## 2023-08-14 NOTE — Patient Instructions (Signed)

## 2023-08-16 ENCOUNTER — Encounter: Payer: Self-pay | Admitting: Cardiology

## 2023-08-16 NOTE — Assessment & Plan Note (Signed)
 No recurrent angina since her PCI back in 2011. GDMT limited by low blood pressures therefore not able to use beta-blocker ARB/ACE inhibitor or calcium  channel blocker. - On aspirin monotherapy for antiplatelet maintenance.  Okay to hold for procedures or surgeries. -Due to statin intolerance, no longer on statin and is on Repatha  140 mg q. 14 days.

## 2023-08-16 NOTE — Assessment & Plan Note (Signed)
 Lipids are now within target range with an LDL of 70 on Repatha  monotherapy.   Statin intolerance precludes the use of other statins also did not tolerate Zetia .

## 2023-08-16 NOTE — Assessment & Plan Note (Signed)
 Noted on CPX.  No longer on therapy.

## 2023-08-17 DIAGNOSIS — I959 Hypotension, unspecified: Secondary | ICD-10-CM | POA: Insufficient documentation

## 2023-08-17 NOTE — Assessment & Plan Note (Signed)
 Low baseline blood pressures.  Continue to recommend adequate hydration and can potentially liberalize salt intake.

## 2023-08-17 NOTE — Assessment & Plan Note (Signed)
 Intolerant of multiple statins because of myopathy and myalgias as well as memory issues.  She subsequently stopped Zetia  because of similar symptoms. Seems to be tolerating Repatha  with only mild myalgias. -Continue Repatha  140 mg every 2 weeks.

## 2023-10-20 ENCOUNTER — Telehealth: Payer: Self-pay | Admitting: Pharmacy Technician

## 2023-10-20 NOTE — Telephone Encounter (Signed)
 Pharmacy Patient Advocate Encounter   Received notification from CoverMyMeds that prior authorization for Repatha  is required/requested.   Insurance verification completed.   The patient is insured through [facility] .   Per test claim: PA required; PA submitted to above mentioned insurance via CoverMyMeds Key/confirmation #/[id number] Status is pending

## 2023-10-21 ENCOUNTER — Other Ambulatory Visit (HOSPITAL_COMMUNITY): Payer: Self-pay

## 2023-10-21 NOTE — Telephone Encounter (Signed)
 Pharmacy Patient Advocate Encounter  Received notification from [facility] that Prior Authorization for Repatha  has been APPROVED from 10/20/23 to 10/19/24. Unable to obtain price due to refill too soon rejection, last fill date 08/04/23 next available fill date07/28/25   PA #/Case ID/Reference #: [id number]

## 2024-01-12 ENCOUNTER — Telehealth: Payer: Self-pay | Admitting: Pharmacy Technician

## 2024-01-12 MED ORDER — REPATHA SURECLICK 140 MG/ML ~~LOC~~ SOAJ: 140.0000 mg | SUBCUTANEOUS | 3 refills | Status: AC

## 2024-01-12 NOTE — Addendum Note (Signed)
 Addended by: Nikcole Eischeid D on: 01/12/2024 01:43 PM   Modules accepted: Orders

## 2024-01-12 NOTE — Telephone Encounter (Signed)
 Hi, we received a refill request for repatha  for this patient from [facility] mail order
